# Patient Record
Sex: Female | Born: 1970 | Race: Black or African American | Hispanic: No | Marital: Married | Smoking: Never smoker
Health system: Southern US, Community
[De-identification: ages and names within clinical notes are randomized; demographics above are authoritative.]

## PROBLEM LIST (undated history)

## (undated) DIAGNOSIS — M722 Plantar fascial fibromatosis: Secondary | ICD-10-CM

## (undated) DIAGNOSIS — D573 Sickle-cell trait: Secondary | ICD-10-CM

## (undated) DIAGNOSIS — E559 Vitamin D deficiency, unspecified: Secondary | ICD-10-CM

## (undated) HISTORY — DX: Plantar fascial fibromatosis: M72.2

## (undated) HISTORY — DX: Morbid (severe) obesity due to excess calories: E66.01

## (undated) HISTORY — PX: TUBAL LIGATION: SHX77

## (undated) HISTORY — PX: APPENDECTOMY: SHX54

## (undated) HISTORY — DX: Vitamin D deficiency, unspecified: E55.9

---

## 2008-02-01 ENCOUNTER — Encounter: Payer: Self-pay | Admitting: Family Medicine

## 2008-02-01 ENCOUNTER — Ambulatory Visit: Payer: Self-pay | Admitting: Family Medicine

## 2008-02-06 LAB — CONVERTED CEMR LAB
Basophils Absolute: 0 10*3/uL (ref 0.0–0.1)
Eosinophils Absolute: 0.1 10*3/uL (ref 0.0–0.7)
Eosinophils Relative: 2 % (ref 0–5)
Hemoglobin: 10.7 g/dL — ABNORMAL LOW (ref 12.0–15.0)
Lymphocytes Relative: 23 % (ref 12–46)
Neutro Abs: 4.3 10*3/uL (ref 1.7–7.7)
Platelets: 279 10*3/uL (ref 150–400)
Sickle Cell Screen: POSITIVE — AB
WBC: 6.5 10*3/uL (ref 4.0–10.5)

## 2008-02-08 ENCOUNTER — Encounter (INDEPENDENT_AMBULATORY_CARE_PROVIDER_SITE_OTHER): Payer: Self-pay | Admitting: Family Medicine

## 2008-02-08 ENCOUNTER — Ambulatory Visit: Payer: Self-pay | Admitting: Family Medicine

## 2008-02-08 ENCOUNTER — Encounter: Payer: Self-pay | Admitting: Family Medicine

## 2008-02-08 LAB — CONVERTED CEMR LAB: Protein, U semiquant: NEGATIVE

## 2008-02-09 ENCOUNTER — Encounter: Payer: Self-pay | Admitting: Family Medicine

## 2008-02-09 LAB — CONVERTED CEMR LAB: Pap Smear: NORMAL

## 2008-02-13 ENCOUNTER — Encounter: Payer: Self-pay | Admitting: Family Medicine

## 2008-02-13 ENCOUNTER — Ambulatory Visit (HOSPITAL_COMMUNITY): Admission: RE | Admit: 2008-02-13 | Discharge: 2008-02-13 | Payer: Self-pay | Admitting: Family Medicine

## 2008-02-16 ENCOUNTER — Encounter: Payer: Self-pay | Admitting: Family Medicine

## 2008-03-06 ENCOUNTER — Ambulatory Visit: Payer: Self-pay | Admitting: Family Medicine

## 2008-03-06 LAB — CONVERTED CEMR LAB: Protein, U semiquant: NEGATIVE

## 2008-03-10 ENCOUNTER — Encounter: Payer: Self-pay | Admitting: Family Medicine

## 2008-03-10 LAB — CONVERTED CEMR LAB: GC Culture Only: NEGATIVE

## 2008-04-02 ENCOUNTER — Telehealth: Payer: Self-pay | Admitting: Family Medicine

## 2008-04-08 ENCOUNTER — Ambulatory Visit: Payer: Self-pay | Admitting: Family Medicine

## 2008-04-08 ENCOUNTER — Encounter: Payer: Self-pay | Admitting: Family Medicine

## 2008-04-08 LAB — CONVERTED CEMR LAB
Glucose, Urine, Semiquant: NEGATIVE
Protein, U semiquant: NEGATIVE

## 2008-04-12 ENCOUNTER — Encounter: Payer: Self-pay | Admitting: Family Medicine

## 2008-04-12 ENCOUNTER — Ambulatory Visit (HOSPITAL_COMMUNITY): Admission: RE | Admit: 2008-04-12 | Discharge: 2008-04-12 | Payer: Self-pay | Admitting: Family Medicine

## 2008-04-16 ENCOUNTER — Telehealth: Payer: Self-pay | Admitting: *Deleted

## 2008-05-03 ENCOUNTER — Telehealth: Payer: Self-pay | Admitting: *Deleted

## 2008-05-06 ENCOUNTER — Encounter: Payer: Self-pay | Admitting: Family Medicine

## 2008-05-06 ENCOUNTER — Ambulatory Visit: Payer: Self-pay | Admitting: Sports Medicine

## 2008-05-06 LAB — CONVERTED CEMR LAB: Glucose, Urine, Semiquant: NEGATIVE

## 2008-05-07 ENCOUNTER — Ambulatory Visit: Payer: Self-pay | Admitting: Obstetrics and Gynecology

## 2008-05-07 ENCOUNTER — Inpatient Hospital Stay (HOSPITAL_COMMUNITY): Admission: AD | Admit: 2008-05-07 | Discharge: 2008-05-07 | Payer: Self-pay | Admitting: Obstetrics & Gynecology

## 2008-05-08 ENCOUNTER — Ambulatory Visit: Payer: Self-pay | Admitting: Family Medicine

## 2008-05-08 ENCOUNTER — Encounter: Payer: Self-pay | Admitting: Family Medicine

## 2008-05-08 LAB — CONVERTED CEMR LAB
AST: 9 units/L (ref 0–37)
Albumin: 3.6 g/dL (ref 3.5–5.2)
Alkaline Phosphatase: 48 units/L (ref 39–117)
Basophils Absolute: 0 10*3/uL (ref 0.0–0.1)
CA 125: 5.5 units/mL (ref 0.0–30.2)
Calcium: 9.6 mg/dL (ref 8.4–10.5)
Creatinine, Ser: 0.52 mg/dL (ref 0.40–1.20)
Eosinophils Absolute: 0.1 10*3/uL (ref 0.0–0.7)
Eosinophils Relative: 2 % (ref 0–5)
Glucose, Bld: 87 mg/dL (ref 70–99)
Hemoglobin: 10.7 g/dL — ABNORMAL LOW (ref 12.0–15.0)
Lymphocytes Relative: 21 % (ref 12–46)
Lymphs Abs: 1.6 10*3/uL (ref 0.7–4.0)
MCHC: 33.3 g/dL (ref 30.0–36.0)
MCV: 79.3 fL (ref 78.0–100.0)
Monocytes Absolute: 0.6 10*3/uL (ref 0.1–1.0)
Neutrophils Relative %: 69 % (ref 43–77)
Potassium: 4.3 meq/L (ref 3.5–5.3)
RBC: 4.05 M/uL (ref 3.87–5.11)
Sodium: 140 meq/L (ref 135–145)
Total Bilirubin: 0.3 mg/dL (ref 0.3–1.2)
Total Protein: 6.9 g/dL (ref 6.0–8.3)
WBC: 7.6 10*3/uL (ref 4.0–10.5)

## 2008-05-13 ENCOUNTER — Telehealth: Payer: Self-pay | Admitting: Family Medicine

## 2008-05-14 ENCOUNTER — Telehealth: Payer: Self-pay | Admitting: Family Medicine

## 2008-05-17 ENCOUNTER — Ambulatory Visit: Payer: Self-pay | Admitting: Obstetrics & Gynecology

## 2008-05-24 ENCOUNTER — Encounter: Payer: Self-pay | Admitting: Family Medicine

## 2008-05-24 ENCOUNTER — Ambulatory Visit (HOSPITAL_COMMUNITY): Admission: RE | Admit: 2008-05-24 | Discharge: 2008-05-24 | Payer: Self-pay | Admitting: Obstetrics & Gynecology

## 2008-05-29 ENCOUNTER — Telehealth: Payer: Self-pay | Admitting: *Deleted

## 2008-05-29 ENCOUNTER — Telehealth: Payer: Self-pay | Admitting: Family Medicine

## 2008-05-29 ENCOUNTER — Encounter: Payer: Self-pay | Admitting: Family Medicine

## 2008-06-04 ENCOUNTER — Ambulatory Visit: Payer: Self-pay | Admitting: Family Medicine

## 2008-06-04 ENCOUNTER — Encounter: Payer: Self-pay | Admitting: Family Medicine

## 2008-06-04 LAB — CONVERTED CEMR LAB
Glucose, Urine, Semiquant: NEGATIVE
Protein, U semiquant: NEGATIVE

## 2008-06-13 ENCOUNTER — Telehealth (INDEPENDENT_AMBULATORY_CARE_PROVIDER_SITE_OTHER): Payer: Self-pay

## 2008-06-17 ENCOUNTER — Encounter: Payer: Self-pay | Admitting: Family Medicine

## 2008-06-20 ENCOUNTER — Encounter: Payer: Self-pay | Admitting: Family Medicine

## 2008-06-26 ENCOUNTER — Ambulatory Visit (HOSPITAL_COMMUNITY): Admission: RE | Admit: 2008-06-26 | Discharge: 2008-06-26 | Payer: Self-pay | Admitting: Obstetrics & Gynecology

## 2008-06-26 ENCOUNTER — Encounter: Payer: Self-pay | Admitting: Family Medicine

## 2008-06-26 ENCOUNTER — Telehealth: Payer: Self-pay | Admitting: Family Medicine

## 2008-07-05 ENCOUNTER — Encounter: Payer: Self-pay | Admitting: Family Medicine

## 2008-07-05 ENCOUNTER — Ambulatory Visit: Payer: Self-pay | Admitting: Family Medicine

## 2008-07-05 LAB — CONVERTED CEMR LAB: Glucose, Urine, Semiquant: NEGATIVE

## 2008-07-08 LAB — CONVERTED CEMR LAB
HCT: 30 % — ABNORMAL LOW (ref 36.0–46.0)
MCHC: 33.3 g/dL (ref 30.0–36.0)

## 2008-07-19 ENCOUNTER — Ambulatory Visit (HOSPITAL_COMMUNITY): Admission: RE | Admit: 2008-07-19 | Discharge: 2008-07-19 | Payer: Self-pay | Admitting: Obstetrics & Gynecology

## 2008-07-19 ENCOUNTER — Encounter: Payer: Self-pay | Admitting: Family Medicine

## 2008-07-25 ENCOUNTER — Ambulatory Visit (HOSPITAL_COMMUNITY): Admission: RE | Admit: 2008-07-25 | Discharge: 2008-07-25 | Payer: Self-pay | Admitting: Obstetrics & Gynecology

## 2008-07-26 ENCOUNTER — Ambulatory Visit (HOSPITAL_COMMUNITY): Admission: RE | Admit: 2008-07-26 | Discharge: 2008-07-26 | Payer: Self-pay | Admitting: Obstetrics & Gynecology

## 2008-08-05 ENCOUNTER — Ambulatory Visit: Payer: Self-pay | Admitting: Family Medicine

## 2008-09-09 ENCOUNTER — Encounter: Payer: Self-pay | Admitting: Family Medicine

## 2008-09-09 ENCOUNTER — Ambulatory Visit: Payer: Self-pay | Admitting: Family Medicine

## 2008-09-09 DIAGNOSIS — I10 Essential (primary) hypertension: Secondary | ICD-10-CM | POA: Insufficient documentation

## 2008-11-28 ENCOUNTER — Encounter: Payer: Self-pay | Admitting: *Deleted

## 2009-04-07 ENCOUNTER — Telehealth: Payer: Self-pay | Admitting: Family Medicine

## 2009-04-07 ENCOUNTER — Encounter: Payer: Self-pay | Admitting: Family Medicine

## 2009-05-29 IMAGING — US US OB COMP +14 WK
1 series · 14 of 28 positions shown · non-contrast
Comparison: none

OBSTETRICAL ULTRASOUND:
 This ultrasound exam was performed in the [HOSPITAL] Ultrasound Department.  The OB US report was generated in the AS system, and faxed to the ordering physician.  This report is also available in [REDACTED] PACS.

[Series 1: us ob comp +14 wk · 0.33mm/px · 14 of 32 slices shown]
[im 2/32]
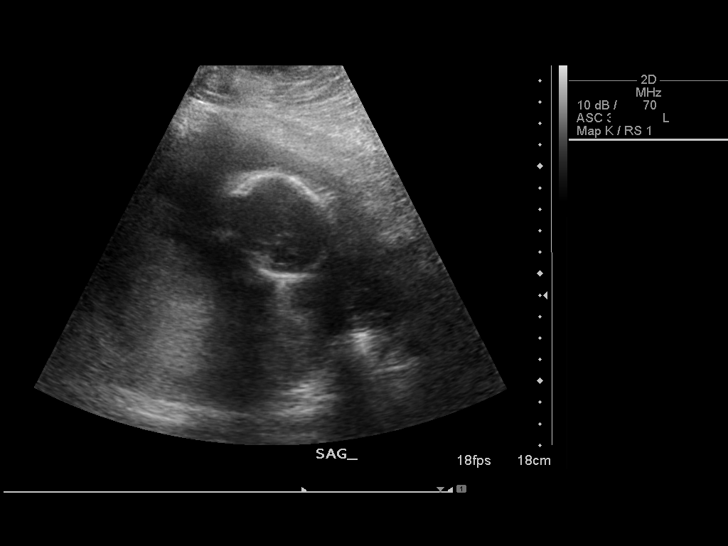
[im 4/32]
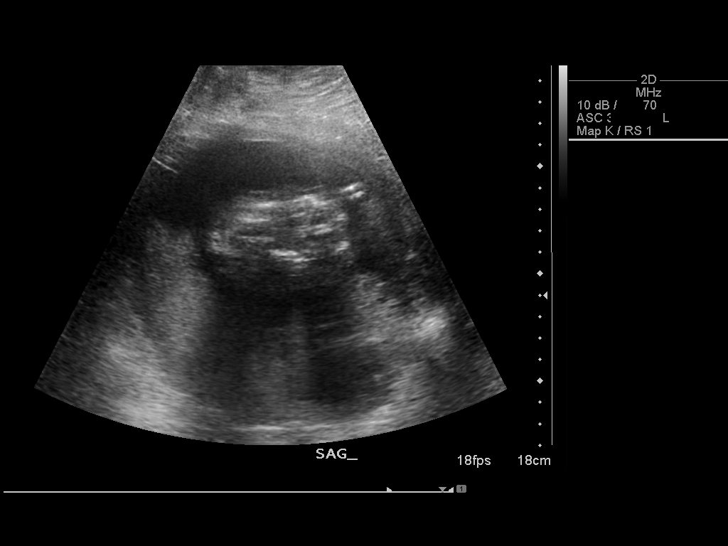
[im 6/32]
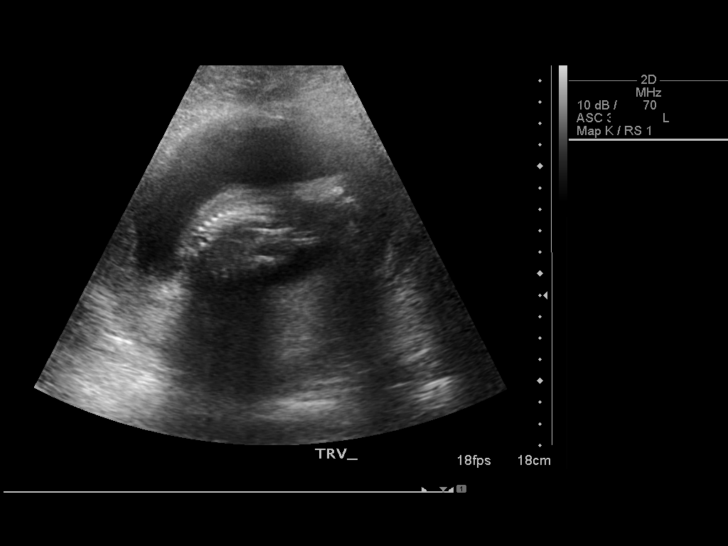
[im 9/32]
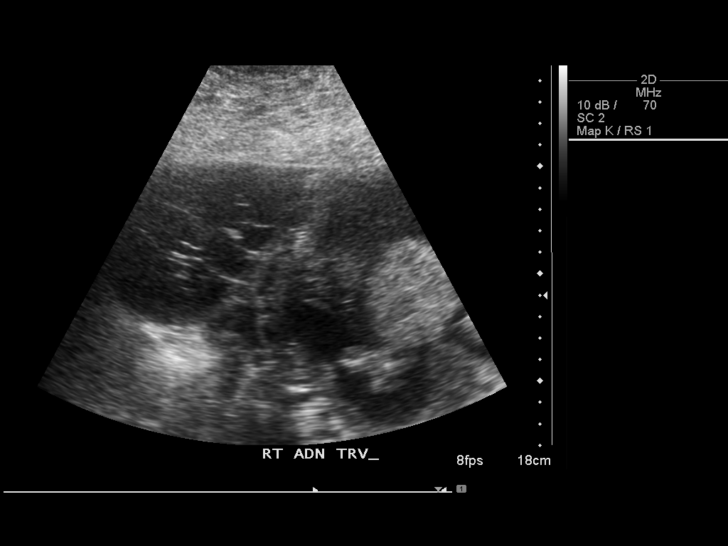
[im 11/32]
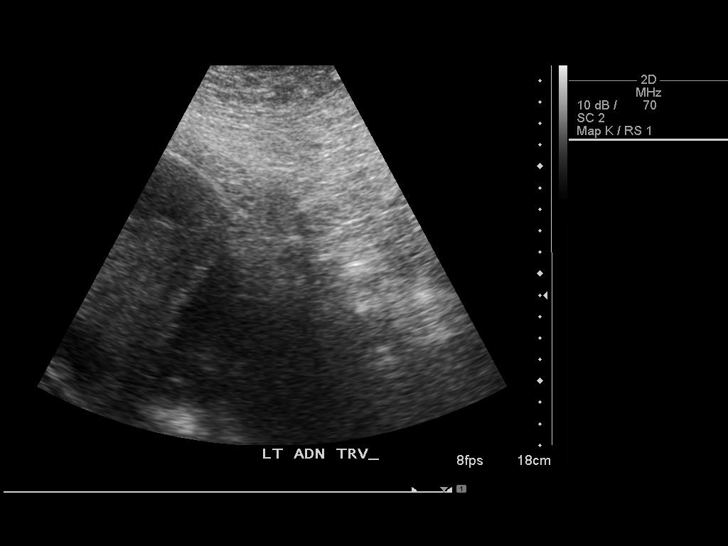
[im 13/32]
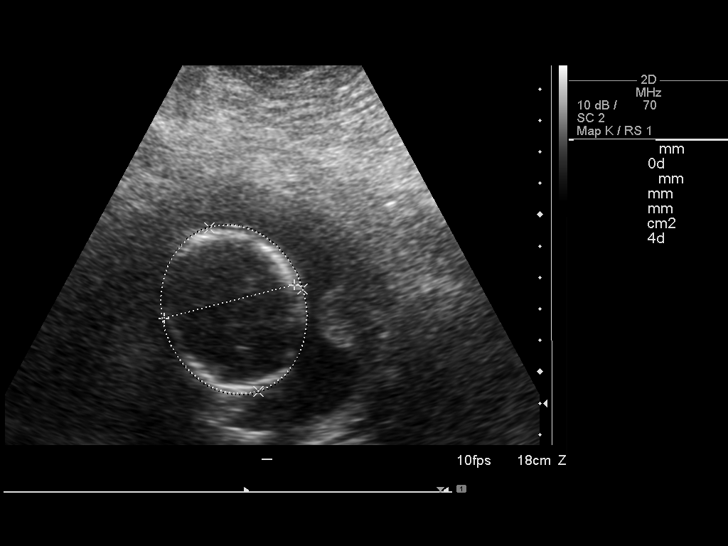
[im 15/32]
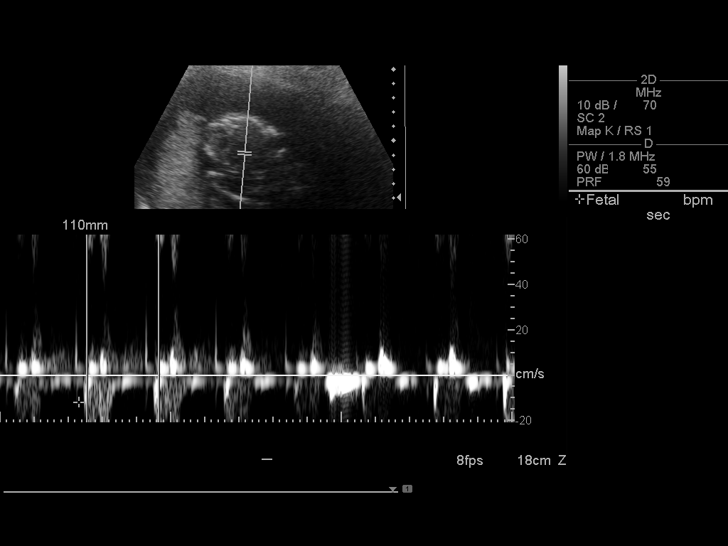
[im 18/32]
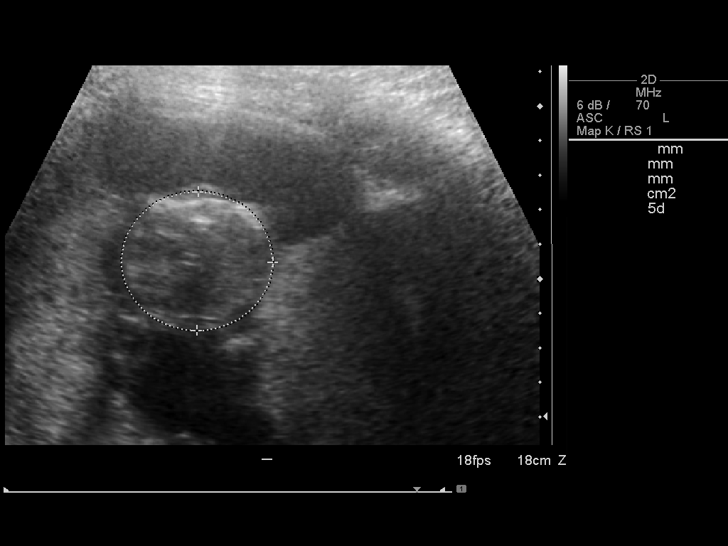
[im 20/32]
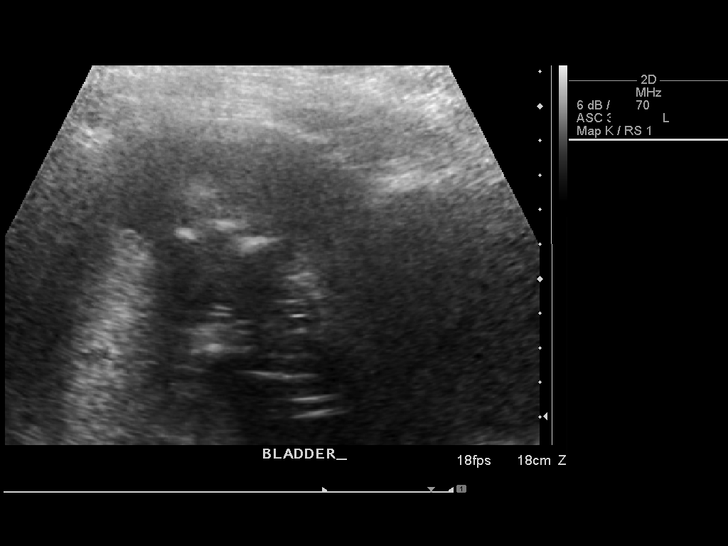
[im 22/32]
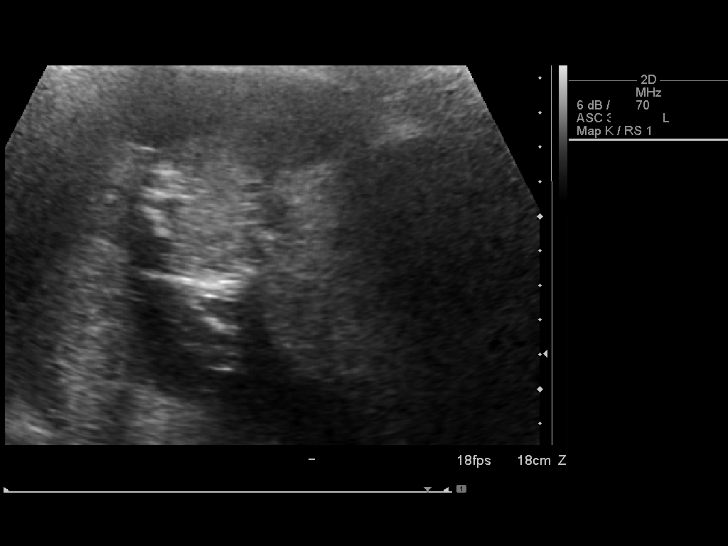
[im 25/32]
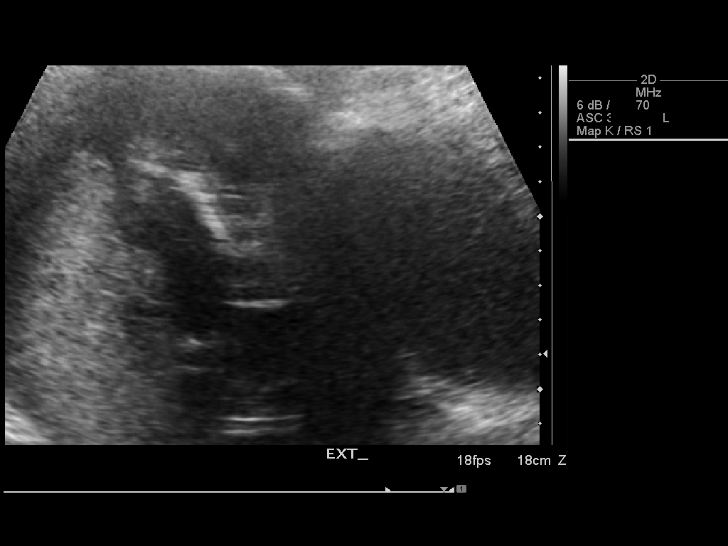
[im 27/32]
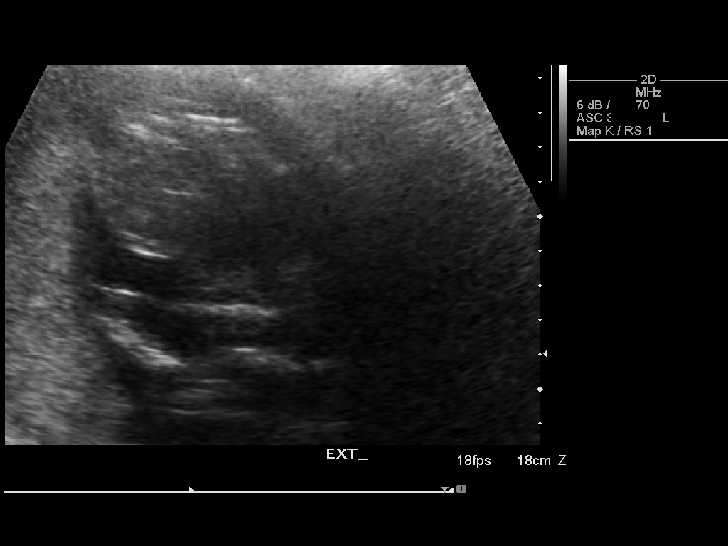
[im 29/32]
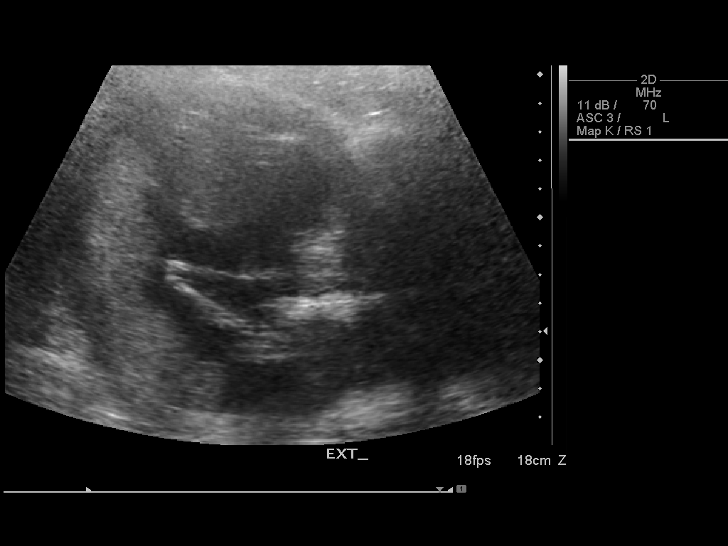
[im 32/32]
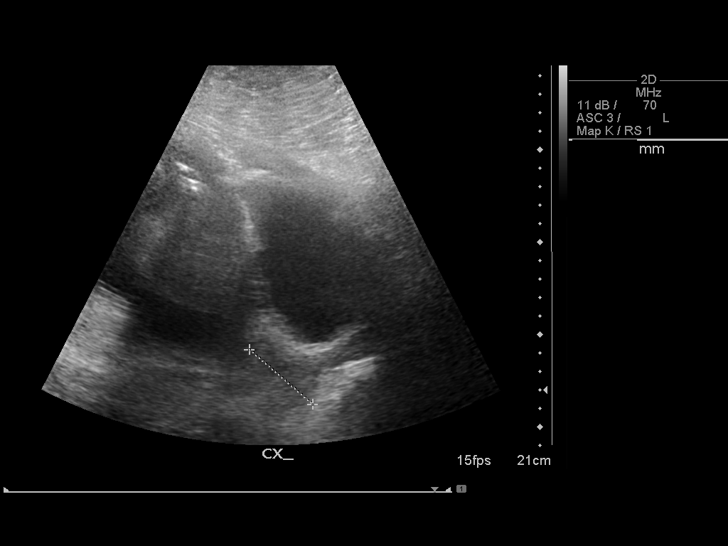

[14 of 28 positions shown; findings below may reference images not displayed]

IMPRESSION: See AS Obstetric US report.

## 2009-07-09 ENCOUNTER — Ambulatory Visit: Payer: Self-pay | Admitting: Family Medicine

## 2009-07-09 ENCOUNTER — Encounter (INDEPENDENT_AMBULATORY_CARE_PROVIDER_SITE_OTHER): Payer: Self-pay | Admitting: Family Medicine

## 2009-07-09 DIAGNOSIS — R011 Cardiac murmur, unspecified: Secondary | ICD-10-CM | POA: Insufficient documentation

## 2009-07-09 DIAGNOSIS — N920 Excessive and frequent menstruation with regular cycle: Secondary | ICD-10-CM | POA: Insufficient documentation

## 2009-07-14 ENCOUNTER — Encounter: Payer: Self-pay | Admitting: Family Medicine

## 2010-10-04 ENCOUNTER — Encounter: Payer: Self-pay | Admitting: Family Medicine

## 2010-10-22 ENCOUNTER — Encounter: Payer: Self-pay | Admitting: *Deleted

## 2011-01-19 ENCOUNTER — Encounter: Payer: Self-pay | Admitting: Family Medicine

## 2011-01-19 ENCOUNTER — Ambulatory Visit (INDEPENDENT_AMBULATORY_CARE_PROVIDER_SITE_OTHER): Payer: BC Managed Care – PPO | Admitting: Family Medicine

## 2011-01-19 DIAGNOSIS — M26629 Arthralgia of temporomandibular joint, unspecified side: Secondary | ICD-10-CM | POA: Insufficient documentation

## 2011-01-19 DIAGNOSIS — R51 Headache: Secondary | ICD-10-CM

## 2011-01-19 DIAGNOSIS — R030 Elevated blood-pressure reading, without diagnosis of hypertension: Secondary | ICD-10-CM

## 2011-01-19 DIAGNOSIS — M2669 Other specified disorders of temporomandibular joint: Secondary | ICD-10-CM

## 2011-01-19 DIAGNOSIS — R519 Headache, unspecified: Secondary | ICD-10-CM | POA: Insufficient documentation

## 2011-01-19 LAB — BASIC METABOLIC PANEL
CO2: 23 mEq/L (ref 19–32)
Calcium: 9.3 mg/dL (ref 8.4–10.5)
Glucose, Bld: 76 mg/dL (ref 70–99)
Sodium: 140 mEq/L (ref 135–145)

## 2011-01-19 MED ORDER — AMLODIPINE BESYLATE 2.5 MG PO TABS: 2.5000 mg | ORAL_TABLET | Freq: Every day | ORAL | Status: DC

## 2011-01-19 NOTE — Assessment & Plan Note (Signed)
This is likely the cause of most of her symptoms including her headache and hearing changes.  She is under a lot of stress recently.  Advised her to work on [employer].  Continue Ibuprofen as needed.  Handout given.

## 2011-01-19 NOTE — Assessment & Plan Note (Signed)
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  It is bitemporal and she is complaining of some blurry vision.  Will check Sed Rate to evaluate for Temporal Arteritis.  She does have some signs of pseudotumor cerebri and is obese.  Neuro exam was normal.  Normal fundoscopic.  If vision doesn't continue to improve would send to ophtalmology for a more comprenesive evaluation.

## 2011-01-19 NOTE — Progress Notes (Signed)
  Subjective:    Patient ID: Sheryl Mcclure, female    DOB: 06-08-1971, 40 y.o.   MRN: [id number]  HPI 1. Headache:  Pt has had a headache for the past 3 days.  Located at both temples.  Overall her headache is getting better.  It has been associated with multiple other symptoms.   - Ear clogged:  She feels that her ears are clogged up.  She is having a difficult time hearing thing as well.  Described as "hearing her heart beat".  Overall this is getting better. - Blurry vision:  She has had some blurry vision where things aren't as clear as they used to be.  Overall this is getting better and is nearly back to normal. - Balance problems:  This started today.  She had trouble maintaining her balance at work.  This is also getting better  2. HTN;  She has had elevated BP readings in the clinic before.  This is her biggest concern.  She doesn't want to have a stroke.  SocHx:  Has started a new job about 2 months ago and has been really stressed out.  Also has lots of other things going on in her life.  Review of Systems  Constitutional: Negative for fever, chills, activity change, appetite change and fatigue.  HENT: Negative for ear pain, congestion, facial swelling, rhinorrhea, neck pain, neck stiffness, postnasal drip, tinnitus and ear discharge.   Eyes: Negative for discharge and itching.  Respiratory: Negative for cough, choking, chest tightness, shortness of breath and wheezing.   Cardiovascular: Negative for chest pain.  Gastrointestinal: Negative.   Musculoskeletal: Negative for arthralgias.  Neurological: Negative for dizziness, syncope, facial asymmetry, speech difficulty, weakness, light-headedness and numbness.  Psychiatric/Behavioral:       + Anxiety       Objective:   Physical Exam  Vitals reviewed. Constitutional: She is oriented to person, place, and time. No distress.       Morbidly obese  HENT:  Head: Normocephalic and atraumatic.  Right Ear: External ear normal.    Left Ear: External ear normal.  Nose: Nose normal.  Mouth/Throat: Oropharynx is clear and moist. No oropharyngeal exudate.       TTP along TMJ and up into the temples.  No swelling, redness, or warmth R ear: normal canal and TM.  Normal landmarks L ear: normal canal and TM.  Normal landmarks  Eyes: Conjunctivae and EOM are normal. Pupils are equal, round, and reactive to light. No scleral icterus.       Normal fundoscopic exam.  No papilledema appreciated.  Neck: Normal range of motion. Neck supple. No thyromegaly present.  Cardiovascular: Normal rate, regular rhythm and normal heart sounds.   Pulmonary/Chest: Effort normal and breath sounds normal. No respiratory distress. She has no wheezes.  Abdominal: Soft.  Musculoskeletal: She exhibits no edema.  Lymphadenopathy:    She has no cervical adenopathy.  Neurological: She is alert and oriented to person, place, and time. She has normal reflexes. She displays normal reflexes. No cranial nerve deficit. She exhibits normal muscle tone. Coordination normal.       Normal gait.  Negative Romberg.  Normal cerebellar tests (RAM, heel-to shin)  Skin: She is not diaphoretic.  Psychiatric:       Anxious appearing          Assessment & Plan:

## 2011-01-19 NOTE — Assessment & Plan Note (Signed)
Not very elevated and doesn't even meet criteria for diagnosis yet.  She is really concerned about her blood pressure so will start her on a low dose Norvasc.

## 2011-01-19 NOTE — Patient Instructions (Signed)
I am not sure what is causing all of your symptoms.  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 We will try and control your blood pressure a little better with Norvasc Please schedule a follow up appointment in 1 weekTMJ Problems  (Temporal Mandibular Joint Dysfunction) TMJ dysfunction means there are problems with the joint between your jaw and your skull. This is a joint lined by cartilage like other joints in your body but also has a small disc in the joint which keeps the bones from rubbing on each other. These joints are like other joints and can get inflamed (sore) from arthritis and other problems. When this joint gets sore, it can cause headaches and pain in the jaw and the face. CAUSES Usually the arthritic types of problems are caused by soreness in the joint. Soreness in the joint can also be caused by overuse. This may come from grinding your teeth. It may also come from mis-alignment in the joint. DIAGNOSIS (LEARNING WHAT IS WRONG) Diagnosis of this condition can often be made by history and exam. Sometimes your caregiver may need X-rays or an MRI scan to determine the exact cause. It may be necessary to see your dentist to determine if your teeth and jaws are lined up correctly. TREATMENT Most of the time this problem is not serious; however, sometimes it can persist (become chronic). When this happens medications that will cut down on inflammation (soreness) help. Sometimes a shot of cortisone into the joint will be helpful. If your teeth are not aligned it may help for your dentist to make a splint for your mouth that can help this problem. If no physical problems can be found, the problem may come from tension. If tension is found to be the cause, biofeedback or relaxation techniques may be helpful. HOME CARE INSTRUCTIONS  Later in the day, applications of ice packs may be helpful. Ice can be used in a plastic bag with a towel around it to prevent frostbite  to skin. This may be used about every 2 hours for 20 to 30 minutes, as needed while awake, or as directed by your caregiver.   Only take over-the-counter or prescription medicines for pain, discomfort, or fever as directed by your caregiver.   If physical therapy was prescribed, follow your caregiver's directions.   Wear mouth appliances as directed if they were given.  Document Released: 05/25/2001 Document Re-Released: 11/26/2008 [facility] Patient Information 2011 [place], [place].

## 2011-01-21 ENCOUNTER — Telehealth: Payer: Self-pay | Admitting: Family Medicine

## 2011-01-21 NOTE — Telephone Encounter (Signed)
Called patient at work.  Her headache has resolved.  Now she is just complaining of feeling weak and tired.  Advised her that the test for inflammation (sed rate) was slightly more elevated than expected.  Advised her to come back next week for reevaluation.

## 2011-01-26 NOTE — Group Therapy Note (Signed)
NAME:  Sheryl Mcclure, Sheryl Mcclure                ACCOUNT NO.:  [id number]   MEDICAL RECORD NO.:  [id number]          PATIENT TYPE:  WOC   LOCATION:  [facility]                   FACILITY:  [facility]   PHYSICIAN:  Elsie Lincoln, MD      DATE OF BIRTH:  1971/01/05   DATE OF SERVICE:                                  CLINIC NOTE   The patient is referred by Dr. Judeth Cornfield in [facility].   REASON FOR CONSULTATION:  Right ovarian cyst noted while the patient is  pregnant.  Cyst is found to be 6.2 x 7.9 x 7.7 cm on an 18-week  ultrasound.  This has shown interval growth compared to her previous  ultrasound done early in June of this year.   The patient is obese but otherwise the pregnancy has been proceeding  normally.  Her EDC is September 09, 2008, currently, today she is a 24  weeks and 1-day estimated gestational age.  She has no complaints.  She  is taking prenatal vitamin and pregnancy monitoring has been otherwise  normal to this point.   PAST MEDICAL HISTORY:  Significant for history of appendectomy status  post rupture and obesity.   ALLERGIES:  No known drug allergies.   MEDICATIONS:  Prenatal vitamins.   PAST OB HISTORY:  The patient is G4, P2-0-1-2 with no history of C-  sections and no abnormal Paps.   REVIEW OF SYSTEMS:  Negative.   OBJECTIVE:  GENERAL:  The patient is afebrile.  Vital signs were stable.  The patient is in no acute distress.  She is obese.  CARDIOVASCULAR:  Regular rate and rhythm.  No murmur, gallops or rubs.  LUNGS:  Clear to auscultation anteriorly, work of breathing is  unlabored.  ABDOMEN:  The patient is obese.  Uterus is gravid.  Fundal height is  estimated to be about 26 cm, however, this exam is very difficult  secondary to the patient's habitus.  EXTREMITIES:  Warm and well perfused.  2+ dorsalis pedis and radial  pulses.   ASSESSMENT AND PLAN:  The patient is a 40 year old female who is G4, P2-  0-1-2 at 52 weeks estimated gestational age  with right ovarian mass  noted on an 18-week ultrasound who is here for consultation of this  right ovarian mass.   PROBLEM:  1. Right ovarian mass.  We discussed the case with Maternal Fetal      Medicine given the patient's late presentation and gestational age      of [redacted] weeks.  Surgical intervention is not recommended at this time      instead we will schedule the patient for followup ultrasound in 1      week with consultation by Maternal Fetal Medicine.  We will likely      not pursue surgical intervention but instead observe the ovary      throughout this pregnancy with stated intervention after completion      of this gestation.  Plan will likely be to monitor with serial      ultrasounds.  This was discussed with the patient and she is in  agreement.  Risks of torsion were discussed with the patient with a      conservative approach.  She is willing to accept these risks.  2. Pregnancy.  These have been routine up to this point, continue      routine OB care, otherwise.     ______________________________  Myrtie Soman, MD    ______________________________  Elsie Lincoln, MD    TE/MEDQ  D:  05/17/2008  T:  05/18/2008  Job:  [id number]   cc:   Maternal-Fetal Medicine  [facility]  [place], [place]

## 2011-05-11 LAB — RPR: RPR: NONREACTIVE

## 2011-05-11 LAB — HIV ANTIBODY (ROUTINE TESTING W REFLEX): HIV: NONREACTIVE

## 2011-05-11 LAB — GC/CHLAMYDIA PROBE AMP, GENITAL: Chlamydia: NEGATIVE

## 2011-06-30 ENCOUNTER — Other Ambulatory Visit (HOSPITAL_COMMUNITY): Payer: Self-pay | Admitting: Obstetrics and Gynecology

## 2011-06-30 DIAGNOSIS — Z3689 Encounter for other specified antenatal screening: Secondary | ICD-10-CM

## 2011-07-06 ENCOUNTER — Ambulatory Visit (HOSPITAL_COMMUNITY)
Admission: RE | Admit: 2011-07-06 | Discharge: 2011-07-06 | Disposition: A | Discharge status: Home / Self Care | Payer: BC Managed Care – PPO | Source: Ambulatory Visit | Attending: Obstetrics and Gynecology | Admitting: Obstetrics and Gynecology

## 2011-07-06 DIAGNOSIS — Z1389 Encounter for screening for other disorder: Secondary | ICD-10-CM | POA: Insufficient documentation

## 2011-07-06 DIAGNOSIS — O09529 Supervision of elderly multigravida, unspecified trimester: Secondary | ICD-10-CM | POA: Insufficient documentation

## 2011-07-06 DIAGNOSIS — O358XX0 Maternal care for other (suspected) fetal abnormality and damage, not applicable or unspecified: Secondary | ICD-10-CM | POA: Insufficient documentation

## 2011-07-06 DIAGNOSIS — Z3689 Encounter for other specified antenatal screening: Secondary | ICD-10-CM

## 2011-07-06 DIAGNOSIS — Z363 Encounter for antenatal screening for malformations: Secondary | ICD-10-CM | POA: Insufficient documentation

## 2011-07-08 ENCOUNTER — Other Ambulatory Visit (HOSPITAL_COMMUNITY): Payer: Self-pay | Admitting: Obstetrics and Gynecology

## 2011-07-08 DIAGNOSIS — Z3689 Encounter for other specified antenatal screening: Secondary | ICD-10-CM

## 2011-07-20 ENCOUNTER — Ambulatory Visit (HOSPITAL_COMMUNITY)
Admission: RE | Admit: 2011-07-20 | Discharge: 2011-07-20 | Disposition: A | Discharge status: Home / Self Care | Payer: BC Managed Care – PPO | Source: Ambulatory Visit | Attending: Obstetrics and Gynecology | Admitting: Obstetrics and Gynecology

## 2011-07-20 DIAGNOSIS — O358XX0 Maternal care for other (suspected) fetal abnormality and damage, not applicable or unspecified: Secondary | ICD-10-CM | POA: Insufficient documentation

## 2011-07-20 DIAGNOSIS — Z3689 Encounter for other specified antenatal screening: Secondary | ICD-10-CM

## 2011-07-20 DIAGNOSIS — O09529 Supervision of elderly multigravida, unspecified trimester: Secondary | ICD-10-CM | POA: Insufficient documentation

## 2011-09-08 ENCOUNTER — Encounter (HOSPITAL_COMMUNITY): Payer: Self-pay

## 2011-09-08 ENCOUNTER — Inpatient Hospital Stay (HOSPITAL_COMMUNITY)
Admission: AD | Admit: 2011-09-08 | Discharge: 2011-09-08 | Disposition: A | Discharge status: Home / Self Care | Payer: BC Managed Care – PPO | Source: Ambulatory Visit | Attending: Obstetrics and Gynecology | Admitting: Obstetrics and Gynecology

## 2011-09-08 DIAGNOSIS — Z3493 Encounter for supervision of normal pregnancy, unspecified, third trimester: Secondary | ICD-10-CM

## 2011-09-08 DIAGNOSIS — Z3480 Encounter for supervision of other normal pregnancy, unspecified trimester: Secondary | ICD-10-CM

## 2011-09-08 DIAGNOSIS — O4690 Antepartum hemorrhage, unspecified, unspecified trimester: Secondary | ICD-10-CM | POA: Insufficient documentation

## 2011-09-08 HISTORY — DX: Sickle-cell trait: D57.3

## 2011-09-08 LAB — URINALYSIS, ROUTINE W REFLEX MICROSCOPIC
Nitrite: NEGATIVE
Protein, ur: NEGATIVE mg/dL
Specific Gravity, Urine: 1.02 (ref 1.005–1.030)
Urobilinogen, UA: 0.2 mg/dL (ref 0.0–1.0)

## 2011-09-08 LAB — URINE MICROSCOPIC-ADD ON

## 2011-09-08 LAB — WET PREP, GENITAL
Trich, Wet Prep: NONE SEEN
Yeast Wet Prep HPF POC: NONE SEEN

## 2011-09-08 NOTE — ED Provider Notes (Signed)
History     Chief Complaint  Patient presents with  . Vaginal Bleeding   HPI Sheryl Mcclure 40 y.o. morbidly obese female at 27w 1d gestation.  Saw blood on the toilet seat at home today after she voided so she came for evaluation.  Had some back pain.  Was very nervous and did not call the office.  Did not have any blood on her underwear.  Did not look at the toilet tissue before flushing.  OB History    Grav Para Term Preterm Abortions TAB SAB Ect Mult Living   5 3 2 1 1  1   3       Past Medical History  Diagnosis Date  . Preterm labor   . Sickle-cell trait     Past Surgical History  Procedure Date  . Appendectomy   . Cesarean section     x1, #3    Family History  Problem Relation Age of Onset  . Hypertension Mother   . Anesthesia problems Neg Hx     History  Substance Use Topics  . Smoking status: Never Smoker   . Smokeless tobacco: Never Used  . Alcohol Use: No    Allergies: No Known Allergies  Prescriptions prior to admission  Medication Sig Dispense Refill  . Prenatal Vit-Fe Fumarate-FA (PRENATAL MULTIVITAMIN) TABS Take 1 tablet by mouth daily.        Marland Kitchen amLODipine (NORVASC) 2.5 MG tablet Take 1 tablet (2.5 mg total) by mouth daily.  30 tablet  11  . Elastic Bandages & Supports (M-4 KNEE HIGH STOCKINGS) MISC please provide patient with knee high TED hose compression stockings         Review of Systems  Genitourinary:       Saw blood on toilet seat    Physical Exam   Blood pressure 123/53, pulse 88, temperature 97.8 F (36.6 C), temperature source Oral, resp. rate 16, height 5\' 6"  (1.676 m), weight 359 lb 6.4 oz (163.023 kg), last menstrual period 12/30/2010.  Physical Exam  Nursing note and vitals reviewed. Constitutional: She is oriented to person, place, and time. She appears well-developed.       Morbidly obese  HENT:  Head: Normocephalic.  Eyes: EOM are normal.  Neck: Neck supple.  GI: Soft. There is no tenderness.       No contractions  on Monitor strip Reassuring FHT for 27 weeks  Genitourinary:       Speculum exam: Vulva - damp with discharge Vagina - Mod amount of yellow discharge, no odor, no blood seen Cervix - No contact bleeding Bimanual exam: Cervix closed Uterus non tender, gravid Adnexa non tender wet prep done Chaperone present for exam.  Musculoskeletal: Normal range of motion.  Neurological: She is alert and oriented to person, place, and time.  Skin: Skin is warm and dry.  Psychiatric: She has a normal mood and affect.    MAU Course  Procedures Results for orders placed during the hospital encounter of 09/08/11 (from the past 24 hour(s))  URINALYSIS, ROUTINE W REFLEX MICROSCOPIC     Status: Abnormal   Collection Time   09/08/11  4:28 PM      Component Value Range   Color, Urine YELLOW  YELLOW    APPearance HAZY (*) CLEAR    Specific Gravity, Urine 1.020  1.005 - 1.030    pH 6.0  5.0 - 8.0    Glucose, UA NEGATIVE  NEGATIVE (mg/dL)   Hgb urine dipstick MODERATE (*) NEGATIVE  Bilirubin Urine NEGATIVE  NEGATIVE    Ketones, ur NEGATIVE  NEGATIVE (mg/dL)   Protein, ur NEGATIVE  NEGATIVE (mg/dL)   Urobilinogen, UA 0.2  0.0 - 1.0 (mg/dL)   Nitrite NEGATIVE  NEGATIVE    Leukocytes, UA LARGE (*) NEGATIVE   URINE MICROSCOPIC-ADD ON     Status: Abnormal   Collection Time   09/08/11  4:28 PM      Component Value Range   Squamous Epithelial / LPF FEW (*) RARE    WBC, UA 7-10  <3 (WBC/hpf)   Bacteria, UA FEW (*) RARE   WET PREP, GENITAL     Status: Abnormal   Collection Time   09/08/11  5:15 PM      Component Value Range   Yeast, Wet Prep NONE SEEN  NONE SEEN    Trich, Wet Prep NONE SEEN  NONE SEEN    Clue Cells, Wet Prep FEW (*) NONE SEEN    WBC, Wet Prep HPF POC TOO NUMEROUS TO COUNT (*) NONE SEEN     MDM Urine culture pending  Assessment and Plan  Pregnancy 27 weeks No vaginal bleeding  Plan Urine culture pending Call the office and be seen if you see any bleeding. Drink at least  8 8-oz glasses of water every day.   BURLESON,TERRI 09/08/2011, 5:40 PM   Nolene Bernheim, NP 09/08/11 1823  Nolene Bernheim, NP 09/11/11 1610

## 2011-09-08 NOTE — Progress Notes (Signed)
One time episode of bleeding today. Hx of spotting earlier in preg, no hx of previa or low lying placenta

## 2011-09-08 NOTE — Progress Notes (Signed)
Pt states started bleeding at 1300 this afternoon while at work, no bleeding at present. Noted lower back pain beginning after 1300, though denies pain at present. Denies uti s/s. Last intercourse 3 days ago.

## 2011-09-08 NOTE — ED Notes (Signed)
Now reports blood was on toilet seat.  ? Had already flushed, no blood in underwear or any since.  No blood noted on exam, yellow d/c noted - wet prep sent

## 2011-09-16 ENCOUNTER — Other Ambulatory Visit (HOSPITAL_COMMUNITY): Payer: Self-pay | Admitting: Obstetrics and Gynecology

## 2011-09-22 ENCOUNTER — Ambulatory Visit (HOSPITAL_COMMUNITY): Payer: BC Managed Care – PPO

## 2011-09-27 ENCOUNTER — Ambulatory Visit (HOSPITAL_COMMUNITY)
Admission: RE | Admit: 2011-09-27 | Discharge: 2011-09-27 | Disposition: A | Discharge status: Home / Self Care | Payer: BC Managed Care – PPO | Source: Ambulatory Visit | Attending: Obstetrics and Gynecology | Admitting: Obstetrics and Gynecology

## 2011-09-27 DIAGNOSIS — Z3689 Encounter for other specified antenatal screening: Secondary | ICD-10-CM | POA: Insufficient documentation

## 2011-09-27 DIAGNOSIS — O09529 Supervision of elderly multigravida, unspecified trimester: Secondary | ICD-10-CM | POA: Insufficient documentation

## 2011-09-27 DIAGNOSIS — O358XX0 Maternal care for other (suspected) fetal abnormality and damage, not applicable or unspecified: Secondary | ICD-10-CM | POA: Insufficient documentation

## 2011-11-12 LAB — STREP B DNA PROBE: GBS: POSITIVE

## 2011-12-10 ENCOUNTER — Observation Stay (HOSPITAL_COMMUNITY)
Admission: RE | Admit: 2011-12-10 | Discharge: 2011-12-11 | Disposition: A | Discharge status: Home / Self Care | Payer: BC Managed Care – PPO | Source: Ambulatory Visit | Attending: Obstetrics & Gynecology | Admitting: Obstetrics & Gynecology

## 2011-12-10 ENCOUNTER — Encounter (HOSPITAL_COMMUNITY): Payer: Self-pay

## 2011-12-10 ENCOUNTER — Inpatient Hospital Stay (HOSPITAL_COMMUNITY): Payer: BC Managed Care – PPO

## 2011-12-10 VITALS — BP 120/58 | HR 105 | Temp 98.1°F | Resp 18 | Ht 67.0 in | Wt 355.0 lb

## 2011-12-10 DIAGNOSIS — Z3689 Encounter for other specified antenatal screening: Secondary | ICD-10-CM

## 2011-12-10 DIAGNOSIS — O99891 Other specified diseases and conditions complicating pregnancy: Secondary | ICD-10-CM | POA: Insufficient documentation

## 2011-12-10 DIAGNOSIS — Z22330 Carrier of Group B streptococcus: Secondary | ICD-10-CM | POA: Insufficient documentation

## 2011-12-10 DIAGNOSIS — O34219 Maternal care for unspecified type scar from previous cesarean delivery: Secondary | ICD-10-CM | POA: Insufficient documentation

## 2011-12-10 DIAGNOSIS — O321XX0 Maternal care for breech presentation, not applicable or unspecified: Principal | ICD-10-CM | POA: Insufficient documentation

## 2011-12-10 LAB — CBC
Hemoglobin: 10.5 g/dL — ABNORMAL LOW (ref 12.0–15.0)
RBC: 3.97 MIL/uL (ref 3.87–5.11)

## 2011-12-10 MED ORDER — ACETAMINOPHEN 325 MG PO TABS: 650.0000 mg | ORAL_TABLET | ORAL | Status: DC | PRN

## 2011-12-10 MED ORDER — BUTORPHANOL TARTRATE 2 MG/ML IJ SOLN: 1.0000 mg | INTRAMUSCULAR | Status: DC | PRN

## 2011-12-10 MED ORDER — OXYCODONE-ACETAMINOPHEN 5-325 MG PO TABS: 1.0000 | ORAL_TABLET | ORAL | Status: DC | PRN

## 2011-12-10 MED ORDER — LACTATED RINGERS IV SOLN: INTRAVENOUS | Status: DC

## 2011-12-10 MED ORDER — FLEET ENEMA 7-19 GM/118ML RE ENEM: 1.0000 | ENEMA | RECTAL | Status: DC | PRN

## 2011-12-10 MED ORDER — LACTATED RINGERS IV SOLN: 500.0000 mL | INTRAVENOUS | Status: DC | PRN

## 2011-12-10 MED ORDER — OXYTOCIN BOLUS FROM INFUSION
500.0000 mL | Freq: Once | INTRAVENOUS | Status: DC
  Filled 2011-12-10: qty 500

## 2011-12-10 MED ORDER — DEXTROSE 5 % IV SOLN
2.5000 10*6.[IU] | INTRAVENOUS | Status: DC
  Filled 2011-12-10 (×4): qty 2.5

## 2011-12-10 MED ORDER — LIDOCAINE HCL (PF) 1 % IJ SOLN: 30.0000 mL | INTRAMUSCULAR | Status: DC | PRN

## 2011-12-10 MED ORDER — ONDANSETRON HCL 4 MG/2ML IJ SOLN: 4.0000 mg | Freq: Four times a day (QID) | INTRAMUSCULAR | Status: DC | PRN

## 2011-12-10 MED ORDER — CITRIC ACID-SODIUM CITRATE 334-500 MG/5ML PO SOLN: 30.0000 mL | ORAL | Status: DC | PRN

## 2011-12-10 MED ORDER — OXYTOCIN 20 UNITS IN LACTATED RINGERS INFUSION - SIMPLE: 125.0000 mL/h | Freq: Once | INTRAVENOUS | Status: DC

## 2011-12-10 MED ORDER — PENICILLIN G POTASSIUM 5000000 UNITS IJ SOLR
5.0000 10*6.[IU] | Freq: Once | INTRAVENOUS | Status: AC
  Administered 2011-12-10: 5 10*6.[IU] via INTRAVENOUS
  Filled 2011-12-10: qty 5

## 2011-12-10 MED ORDER — IBUPROFEN 600 MG PO TABS: 600.0000 mg | ORAL_TABLET | Freq: Four times a day (QID) | ORAL | Status: DC | PRN

## 2011-12-10 NOTE — Progress Notes (Signed)
Kaplan performed SVE. MD requested U/S to verify presenting part. MD performed Bedside U/S and confirmed Breech presentation.  Orders for BPP and plan to D/C home if WNL and schedule c/s for Monday.

## 2011-12-10 NOTE — Progress Notes (Signed)
On initial exam vertex had been confirmed by bedside ultrasound and was palpated on cervical exam.  I planned to place the Foley after GBS prophylaxis was begun.  When this had been accomplished I attempted to place the foley and I could no longer palpate the internal os or the presenting part.  Repeat ultrasound showed the baby had spontaneously verted to Breech.  I will do a formal ultrasound BPP, EFW.  Will discharge home and plan to proceed with repeat C/S and BTL on April 1st unless she spontaneously reverts to vertex.

## 2011-12-10 NOTE — Progress Notes (Signed)
Dr. Arlyce Dice called. Coming to place foley bulb.  Md notified of SVE with RN unable to verify presenting part.

## 2011-12-10 NOTE — H&P (Signed)
40 y.o. [id number]  Estimated Date of Delivery: 12/07/11 admitted at 40/[redacted] weeks gestation induction. Prenatal course was complicated by previous Cesarean.  She had her first 2 children vaginally.  Her BMI is 55. Prenatal labs: Blood Type:O+.  Screening tests for HIV, Syphilis, Hepatitis B, Rubella sensitivity, and gestational diabetes were negative.  Perineal group B strep colonization was positive.  Afebrile, VSS Heart and Lungs: No active disease Abdomen: soft, gravid, EFW AGA. Cervical exam:  1/40 vtx -2.  Impression: Term pregnancy, previous C/S.  Previous vaginal deliveries x2.  Plan:  Foley bulb for cervical ripening.

## 2011-12-11 ENCOUNTER — Encounter (HOSPITAL_COMMUNITY): Payer: Self-pay | Admitting: Pharmacist

## 2011-12-11 ENCOUNTER — Inpatient Hospital Stay (HOSPITAL_COMMUNITY): Payer: BC Managed Care – PPO

## 2011-12-11 LAB — RPR: RPR Ser Ql: NONREACTIVE

## 2011-12-11 NOTE — Progress Notes (Signed)
Pt back from US

## 2011-12-11 NOTE — Progress Notes (Signed)
Called Dr. Arlyce Dice with U/S results. BPP 8/8. Fluid level normal. MD aware. Diacharge orders given. Have pt return Monday 0900 for scheduled C/S at 12:00.

## 2011-12-11 NOTE — Discharge Summary (Signed)
Discharge Diagnosis: Breech Procedure: Ultrasound Hospital Course:  Presented for cervical ripening.  Vertex by bedside ultrasound and digital exam by me.  Given GBS prophyaxis.  Reexam 2 hours later by me revealed Breech on digital exam and ultrasound.  Discharge home to return April 1 for C/S if still breech.

## 2011-12-11 NOTE — Progress Notes (Signed)
Discharge instructions given to pt, along with instructions to return Monday morning at 0900 for C/S at noon. Pt instructed not to eat or drink after midnight. Pt verbalized understanding.

## 2011-12-13 ENCOUNTER — Encounter (HOSPITAL_COMMUNITY): Payer: Self-pay | Admitting: Anesthesiology

## 2011-12-13 ENCOUNTER — Inpatient Hospital Stay (HOSPITAL_COMMUNITY)
Admission: RE | Admit: 2011-12-13 | Discharge: 2011-12-17 | DRG: 371 | Disposition: A | Discharge status: Home / Self Care | Payer: BC Managed Care – PPO | Source: Ambulatory Visit | Attending: Obstetrics and Gynecology | Admitting: Obstetrics and Gynecology

## 2011-12-13 ENCOUNTER — Encounter (HOSPITAL_COMMUNITY): Payer: Self-pay | Admitting: *Deleted

## 2011-12-13 ENCOUNTER — Encounter (HOSPITAL_COMMUNITY)
Admission: RE | Disposition: A | Discharge status: Home / Self Care | Payer: Self-pay | Source: Ambulatory Visit | Attending: Obstetrics and Gynecology

## 2011-12-13 ENCOUNTER — Inpatient Hospital Stay (HOSPITAL_COMMUNITY): Payer: BC Managed Care – PPO | Admitting: Anesthesiology

## 2011-12-13 DIAGNOSIS — I10 Essential (primary) hypertension: Secondary | ICD-10-CM

## 2011-12-13 DIAGNOSIS — O99892 Other specified diseases and conditions complicating childbirth: Secondary | ICD-10-CM | POA: Diagnosis present

## 2011-12-13 DIAGNOSIS — O322XX0 Maternal care for transverse and oblique lie, not applicable or unspecified: Secondary | ICD-10-CM | POA: Diagnosis present

## 2011-12-13 DIAGNOSIS — Z302 Encounter for sterilization: Secondary | ICD-10-CM

## 2011-12-13 DIAGNOSIS — M26629 Arthralgia of temporomandibular joint, unspecified side: Secondary | ICD-10-CM

## 2011-12-13 DIAGNOSIS — N736 Female pelvic peritoneal adhesions (postinfective): Secondary | ICD-10-CM | POA: Diagnosis present

## 2011-12-13 DIAGNOSIS — R011 Cardiac murmur, unspecified: Secondary | ICD-10-CM

## 2011-12-13 DIAGNOSIS — R51 Headache: Secondary | ICD-10-CM

## 2011-12-13 DIAGNOSIS — Z3480 Encounter for supervision of other normal pregnancy, unspecified trimester: Secondary | ICD-10-CM

## 2011-12-13 DIAGNOSIS — O09529 Supervision of elderly multigravida, unspecified trimester: Secondary | ICD-10-CM | POA: Diagnosis present

## 2011-12-13 DIAGNOSIS — N920 Excessive and frequent menstruation with regular cycle: Secondary | ICD-10-CM

## 2011-12-13 DIAGNOSIS — O34219 Maternal care for unspecified type scar from previous cesarean delivery: Principal | ICD-10-CM | POA: Diagnosis present

## 2011-12-13 DIAGNOSIS — E669 Obesity, unspecified: Secondary | ICD-10-CM | POA: Diagnosis present

## 2011-12-13 SURGERY — Surgical Case
Anesthesia: Spinal | Site: Abdomen | Wound class: Clean Contaminated

## 2011-12-13 MED ORDER — PHENYLEPHRINE 40 MCG/ML (10ML) SYRINGE FOR IV PUSH (FOR BLOOD PRESSURE SUPPORT)
PREFILLED_SYRINGE | INTRAVENOUS | Status: AC
  Filled 2011-12-13: qty 5

## 2011-12-13 MED ORDER — FENTANYL CITRATE 0.05 MG/ML IJ SOLN
INTRAMUSCULAR | Status: AC
  Filled 2011-12-13: qty 2

## 2011-12-13 MED ORDER — ONDANSETRON HCL 4 MG/2ML IJ SOLN
INTRAMUSCULAR | Status: DC | PRN
  Administered 2011-12-13: 4 mg via INTRAVENOUS

## 2011-12-13 MED ORDER — EPHEDRINE SULFATE 50 MG/ML IJ SOLN
INTRAMUSCULAR | Status: DC | PRN
  Administered 2011-12-13: 15 mg via INTRAVENOUS
  Administered 2011-12-13: 10 mg via INTRAVENOUS

## 2011-12-13 MED ORDER — PHENYLEPHRINE HCL 10 MG/ML IJ SOLN
INTRAMUSCULAR | Status: DC | PRN
  Administered 2011-12-13: 80 ug via INTRAVENOUS
  Administered 2011-12-13: 120 ug via INTRAVENOUS
  Administered 2011-12-13: 80 ug via INTRAVENOUS
  Administered 2011-12-13: 40 ug via INTRAVENOUS
  Administered 2011-12-13 (×2): 80 ug via INTRAVENOUS

## 2011-12-13 MED ORDER — KETOROLAC TROMETHAMINE 60 MG/2ML IM SOLN
60.0000 mg | Freq: Once | INTRAMUSCULAR | Status: AC | PRN
  Administered 2011-12-13: 60 mg via INTRAMUSCULAR

## 2011-12-13 MED ORDER — ONDANSETRON HCL 4 MG PO TABS: 4.0000 mg | ORAL_TABLET | ORAL | Status: DC | PRN

## 2011-12-13 MED ORDER — OXYCODONE-ACETAMINOPHEN 5-325 MG PO TABS
1.0000 | ORAL_TABLET | ORAL | Status: DC | PRN
  Administered 2011-12-15 (×2): 2 via ORAL
  Administered 2011-12-15: 1 via ORAL
  Administered 2011-12-15: 2 via ORAL
  Administered 2011-12-16 (×2): 1 via ORAL
  Administered 2011-12-16 (×2): 2 via ORAL
  Administered 2011-12-16: 1 via ORAL
  Administered 2011-12-17 (×3): 2 via ORAL
  Filled 2011-12-13: qty 2
  Filled 2011-12-13 (×3): qty 1
  Filled 2011-12-13 (×5): qty 2
  Filled 2011-12-13: qty 1
  Filled 2011-12-13 (×2): qty 2
  Filled 2011-12-13: qty 1

## 2011-12-13 MED ORDER — ONDANSETRON HCL 4 MG/2ML IJ SOLN: 4.0000 mg | Freq: Three times a day (TID) | INTRAMUSCULAR | Status: DC | PRN

## 2011-12-13 MED ORDER — SODIUM CHLORIDE 0.9 % IV SOLN
1.0000 ug/kg/h | INTRAVENOUS | Status: DC | PRN
  Filled 2011-12-13: qty 2.5

## 2011-12-13 MED ORDER — MEASLES, MUMPS & RUBELLA VAC ~~LOC~~ INJ
0.5000 mL | INJECTION | Freq: Once | SUBCUTANEOUS | Status: DC
  Filled 2011-12-13: qty 0.5

## 2011-12-13 MED ORDER — IBUPROFEN 600 MG PO TABS
600.0000 mg | ORAL_TABLET | Freq: Four times a day (QID) | ORAL | Status: DC | PRN
  Filled 2011-12-13 (×5): qty 1

## 2011-12-13 MED ORDER — KETOROLAC TROMETHAMINE 30 MG/ML IJ SOLN
INTRAMUSCULAR | Status: AC
  Filled 2011-12-13: qty 1

## 2011-12-13 MED ORDER — BUPIVACAINE IN DEXTROSE 0.75-8.25 % IT SOLN
INTRATHECAL | Status: DC | PRN
  Administered 2011-12-13: 1.7 mL via INTRATHECAL

## 2011-12-13 MED ORDER — MEPERIDINE HCL 25 MG/ML IJ SOLN
INTRAMUSCULAR | Status: AC
  Filled 2011-12-13: qty 1

## 2011-12-13 MED ORDER — KETOROLAC TROMETHAMINE 30 MG/ML IJ SOLN: 30.0000 mg | Freq: Four times a day (QID) | INTRAMUSCULAR | Status: AC | PRN

## 2011-12-13 MED ORDER — WITCH HAZEL-GLYCERIN EX PADS: 1.0000 "application " | MEDICATED_PAD | CUTANEOUS | Status: DC | PRN

## 2011-12-13 MED ORDER — ONDANSETRON HCL 4 MG/2ML IJ SOLN: 4.0000 mg | INTRAMUSCULAR | Status: DC | PRN

## 2011-12-13 MED ORDER — EPHEDRINE 5 MG/ML INJ
INTRAVENOUS | Status: AC
  Filled 2011-12-13: qty 10

## 2011-12-13 MED ORDER — MEPERIDINE HCL 25 MG/ML IJ SOLN: 6.2500 mg | INTRAMUSCULAR | Status: DC | PRN

## 2011-12-13 MED ORDER — ZOLPIDEM TARTRATE 5 MG PO TABS: 5.0000 mg | ORAL_TABLET | Freq: Every evening | ORAL | Status: DC | PRN

## 2011-12-13 MED ORDER — MIDAZOLAM HCL 2 MG/2ML IJ SOLN
INTRAMUSCULAR | Status: AC
  Filled 2011-12-13: qty 2

## 2011-12-13 MED ORDER — DIPHENHYDRAMINE HCL 50 MG/ML IJ SOLN: 12.5000 mg | INTRAMUSCULAR | Status: DC | PRN

## 2011-12-13 MED ORDER — MORPHINE SULFATE (PF) 0.5 MG/ML IJ SOLN
INTRAMUSCULAR | Status: DC | PRN
  Administered 2011-12-13: .15 mg via EPIDURAL

## 2011-12-13 MED ORDER — SCOPOLAMINE 1 MG/3DAYS TD PT72
1.0000 | MEDICATED_PATCH | Freq: Once | TRANSDERMAL | Status: DC
  Filled 2011-12-13: qty 1

## 2011-12-13 MED ORDER — IBUPROFEN 600 MG PO TABS
600.0000 mg | ORAL_TABLET | Freq: Four times a day (QID) | ORAL | Status: DC
  Administered 2011-12-14 – 2011-12-17 (×11): 600 mg via ORAL
  Filled 2011-12-13 (×7): qty 1

## 2011-12-13 MED ORDER — LACTATED RINGERS IV SOLN
INTRAVENOUS | Status: DC
  Administered 2011-12-13 (×2): via INTRAVENOUS

## 2011-12-13 MED ORDER — MUPIROCIN 2 % EX OINT
TOPICAL_OINTMENT | Freq: Two times a day (BID) | CUTANEOUS | Status: DC
  Administered 2011-12-13: 1 via NASAL

## 2011-12-13 MED ORDER — MORPHINE SULFATE 0.5 MG/ML IJ SOLN
INTRAMUSCULAR | Status: AC
  Filled 2011-12-13: qty 10

## 2011-12-13 MED ORDER — OXYTOCIN 10 UNIT/ML IJ SOLN
INTRAMUSCULAR | Status: AC
  Filled 2011-12-13: qty 2

## 2011-12-13 MED ORDER — SODIUM CHLORIDE 0.9 % IJ SOLN
3.0000 mL | INTRAMUSCULAR | Status: DC | PRN
  Administered 2011-12-15: 3 mL via INTRAVENOUS

## 2011-12-13 MED ORDER — DEXAMETHASONE SODIUM PHOSPHATE 10 MG/ML IJ SOLN
INTRAMUSCULAR | Status: DC | PRN
  Administered 2011-12-13: 10 mg via INTRAVENOUS

## 2011-12-13 MED ORDER — PRENATAL MULTIVITAMIN CH
1.0000 | ORAL_TABLET | Freq: Every day | ORAL | Status: DC
  Administered 2011-12-16 – 2011-12-17 (×2): 1 via ORAL
  Filled 2011-12-13 (×3): qty 1

## 2011-12-13 MED ORDER — MIDAZOLAM HCL 5 MG/5ML IJ SOLN
INTRAMUSCULAR | Status: DC | PRN
  Administered 2011-12-13: 2 mg via INTRAVENOUS

## 2011-12-13 MED ORDER — OXYTOCIN 20 UNITS IN LACTATED RINGERS INFUSION - SIMPLE: 125.0000 mL/h | INTRAVENOUS | Status: AC

## 2011-12-13 MED ORDER — KETOROLAC TROMETHAMINE 60 MG/2ML IM SOLN
INTRAMUSCULAR | Status: AC
  Administered 2011-12-13: 60 mg via INTRAMUSCULAR
  Filled 2011-12-13: qty 2

## 2011-12-13 MED ORDER — DEXAMETHASONE SODIUM PHOSPHATE 10 MG/ML IJ SOLN
INTRAMUSCULAR | Status: AC
  Filled 2011-12-13: qty 1

## 2011-12-13 MED ORDER — DIPHENHYDRAMINE HCL 50 MG/ML IJ SOLN: 25.0000 mg | INTRAMUSCULAR | Status: DC | PRN

## 2011-12-13 MED ORDER — HYDROMORPHONE HCL PF 1 MG/ML IJ SOLN
0.2500 mg | INTRAMUSCULAR | Status: DC | PRN
  Administered 2011-12-13 (×2): 0.5 mg via INTRAVENOUS

## 2011-12-13 MED ORDER — OXYTOCIN 10 UNIT/ML IJ SOLN
INTRAMUSCULAR | Status: DC | PRN
  Administered 2011-12-13 (×2): 20 [IU] via INTRAMUSCULAR

## 2011-12-13 MED ORDER — SCOPOLAMINE 1 MG/3DAYS TD PT72
MEDICATED_PATCH | TRANSDERMAL | Status: AC
  Administered 2011-12-13: 1.5 mg via TRANSDERMAL
  Filled 2011-12-13: qty 1

## 2011-12-13 MED ORDER — SCOPOLAMINE 1 MG/3DAYS TD PT72
1.0000 | MEDICATED_PATCH | Freq: Once | TRANSDERMAL | Status: AC
  Administered 2011-12-13: 1 via TRANSDERMAL
  Administered 2011-12-13: 1.5 mg via TRANSDERMAL

## 2011-12-13 MED ORDER — METOCLOPRAMIDE HCL 5 MG/ML IJ SOLN: 10.0000 mg | Freq: Three times a day (TID) | INTRAMUSCULAR | Status: DC | PRN

## 2011-12-13 MED ORDER — NALOXONE HCL 0.4 MG/ML IJ SOLN: 0.4000 mg | INTRAMUSCULAR | Status: DC | PRN

## 2011-12-13 MED ORDER — TETANUS-DIPHTH-ACELL PERTUSSIS 5-2.5-18.5 LF-MCG/0.5 IM SUSP
0.5000 mL | Freq: Once | INTRAMUSCULAR | Status: AC
  Administered 2011-12-14: 0.5 mL via INTRAMUSCULAR
  Filled 2011-12-13: qty 0.5

## 2011-12-13 MED ORDER — DIBUCAINE 1 % RE OINT: 1.0000 "application " | TOPICAL_OINTMENT | RECTAL | Status: DC | PRN

## 2011-12-13 MED ORDER — CEFAZOLIN SODIUM 1-5 GM-% IV SOLN
INTRAVENOUS | Status: AC
  Filled 2011-12-13: qty 50

## 2011-12-13 MED ORDER — PHENYLEPHRINE 40 MCG/ML (10ML) SYRINGE FOR IV PUSH (FOR BLOOD PRESSURE SUPPORT)
PREFILLED_SYRINGE | INTRAVENOUS | Status: AC
  Filled 2011-12-13: qty 10

## 2011-12-13 MED ORDER — NALBUPHINE HCL 10 MG/ML IJ SOLN
5.0000 mg | INTRAMUSCULAR | Status: DC | PRN
  Filled 2011-12-13: qty 1

## 2011-12-13 MED ORDER — FENTANYL CITRATE 0.05 MG/ML IJ SOLN
INTRAMUSCULAR | Status: DC | PRN
  Administered 2011-12-13: 25 ug via INTRATHECAL

## 2011-12-13 MED ORDER — LACTATED RINGERS IV SOLN
INTRAVENOUS | Status: DC
  Administered 2011-12-13 – 2011-12-15 (×6): via INTRAVENOUS

## 2011-12-13 MED ORDER — KETOROLAC TROMETHAMINE 30 MG/ML IJ SOLN
30.0000 mg | Freq: Four times a day (QID) | INTRAMUSCULAR | Status: AC | PRN
  Administered 2011-12-13 – 2011-12-14 (×3): 30 mg via INTRAVENOUS
  Filled 2011-12-13 (×3): qty 1

## 2011-12-13 MED ORDER — DEXTROSE 5 % IV SOLN
1.0000 g | Freq: Two times a day (BID) | INTRAVENOUS | Status: AC
  Administered 2011-12-13 – 2011-12-15 (×4): 1 g via INTRAVENOUS
  Filled 2011-12-13 (×4): qty 1

## 2011-12-13 MED ORDER — 0.9 % SODIUM CHLORIDE (POUR BTL) OPTIME
TOPICAL | Status: DC | PRN
  Administered 2011-12-13: 1000 mL

## 2011-12-13 MED ORDER — ONDANSETRON HCL 4 MG/2ML IJ SOLN
INTRAMUSCULAR | Status: AC
  Filled 2011-12-13: qty 2

## 2011-12-13 MED ORDER — HYDROMORPHONE HCL PF 1 MG/ML IJ SOLN
INTRAMUSCULAR | Status: AC
  Administered 2011-12-13: 0.5 mg via INTRAVENOUS
  Filled 2011-12-13: qty 1

## 2011-12-13 MED ORDER — FENTANYL CITRATE 0.05 MG/ML IJ SOLN
INTRAMUSCULAR | Status: DC | PRN
Start: 2011-12-13 — End: 2011-12-13
  Administered 2011-12-13 (×2): 50 ug via INTRAVENOUS

## 2011-12-13 MED ORDER — MORPHINE SULFATE (PF) 0.5 MG/ML IJ SOLN
INTRAMUSCULAR | Status: DC | PRN
  Administered 2011-12-13: 4 mg via EPIDURAL
  Administered 2011-12-13: .85 mg via INTRAVENOUS

## 2011-12-13 MED ORDER — MUPIROCIN 2 % EX OINT
TOPICAL_OINTMENT | CUTANEOUS | Status: AC
  Administered 2011-12-13: 1 via NASAL
  Filled 2011-12-13: qty 22

## 2011-12-13 MED ORDER — CEFAZOLIN SODIUM-DEXTROSE 2-3 GM-% IV SOLR
2.0000 g | INTRAVENOUS | Status: AC
  Administered 2011-12-13: 2 g via INTRAVENOUS
  Filled 2011-12-13: qty 50

## 2011-12-13 MED ORDER — DIPHENHYDRAMINE HCL 25 MG PO CAPS: 25.0000 mg | ORAL_CAPSULE | ORAL | Status: DC | PRN

## 2011-12-13 SURGICAL SUPPLY — 35 items
BARRIER ADHS 3X4 INTERCEED (GAUZE/BANDAGES/DRESSINGS) ×6 IMPLANT
CLIP FILSHIE TUBAL LIGA STRL (Clip) ×3 IMPLANT
CLOTH BEACON ORANGE TIMEOUT ST (SAFETY) ×3 IMPLANT
CONTAINER PREFILL 10% NBF 15ML (MISCELLANEOUS) IMPLANT
DRAPE CESAREAN BIRTH W POUCH (DRAPES) ×3 IMPLANT
DRESSING TELFA 8X3 (GAUZE/BANDAGES/DRESSINGS) ×3 IMPLANT
ELECT REM PT RETURN 9FT ADLT (ELECTROSURGICAL) ×3
ELECTRODE REM PT RTRN 9FT ADLT (ELECTROSURGICAL) ×2 IMPLANT
EXTRACTOR VACUUM M CUP 4 TUBE (SUCTIONS) ×3 IMPLANT
GLOVE ECLIPSE 7.0 STRL STRAW (GLOVE) ×15 IMPLANT
GOWN PREVENTION PLUS LG XLONG (DISPOSABLE) ×3 IMPLANT
GOWN PREVENTION PLUS XLARGE (GOWN DISPOSABLE) ×3 IMPLANT
KIT ABG SYR 3ML LUER SLIP (SYRINGE) IMPLANT
NEEDLE HYPO 25X5/8 SAFETYGLIDE (NEEDLE) IMPLANT
NS IRRIG 1000ML POUR BTL (IV SOLUTION) ×3 IMPLANT
PACK C SECTION WH (CUSTOM PROCEDURE TRAY) ×3 IMPLANT
PAD ABD 7.5X8 STRL (GAUZE/BANDAGES/DRESSINGS) ×3 IMPLANT
RETRACTOR WND ALEXIS 25 LRG (MISCELLANEOUS) ×2 IMPLANT
RETRACTOR WOUND ALXS 34CM XLRG (MISCELLANEOUS) IMPLANT
RTRCTR WOUND ALEXIS 25CM LRG (MISCELLANEOUS) ×3
RTRCTR WOUND ALEXIS 34CM XLRG (MISCELLANEOUS)
SLEEVE SCD COMPRESS KNEE MED (MISCELLANEOUS) IMPLANT
SPONGE GAUZE 4X4 12PLY (GAUZE/BANDAGES/DRESSINGS) ×3 IMPLANT
STAPLER VISISTAT 35W (STAPLE) ×3 IMPLANT
SUT CHROMIC 3 0 SH 27 (SUTURE) ×15 IMPLANT
SUT MNCRL 0 VIOLET CTX 36 (SUTURE) ×6 IMPLANT
SUT MON AB 2-0 CT1 27 (SUTURE) ×6 IMPLANT
SUT MONOCRYL 0 CTX 36 (SUTURE) ×3
SUT PDS AB 0 CTX 60 (SUTURE) ×6 IMPLANT
SUT PLAIN 0 NONE (SUTURE) IMPLANT
SUT PLAIN 2 0 XLH (SUTURE) ×6 IMPLANT
TAPE CLOTH SURG 4X10 WHT LF (GAUZE/BANDAGES/DRESSINGS) ×3 IMPLANT
TOWEL OR 17X24 6PK STRL BLUE (TOWEL DISPOSABLE) ×6 IMPLANT
TRAY FOLEY CATH 14FR (SET/KITS/TRAYS/PACK) ×3 IMPLANT
WATER STERILE IRR 1000ML POUR (IV SOLUTION) ×3 IMPLANT

## 2011-12-13 NOTE — OR Nursing (Signed)
12-13-11 Filshie Cllip implanted @ left fallopian tube in OR suite per Dr. Dareen Piano. Serial # [id number]. Exp. Date 02-11-14. Right fallopian tube not present.

## 2011-12-13 NOTE — Anesthesia Postprocedure Evaluation (Signed)
  Anesthesia Post-op Note  Patient: Sheryl Mcclure  Procedure(s) Performed: Procedure(s) (LRB): CESAREAN SECTION WITH BILATERAL TUBAL LIGATION (Bilateral)  Patient is awake, responsive, moving her legs, and has signs of resolution of her numbness. Pain and nausea are reasonably well controlled. Vital signs are stable and clinically acceptable. Oxygen saturation is clinically acceptable. There are no apparent anesthetic complications at this time. Patient is ready for discharge.

## 2011-12-13 NOTE — Anesthesia Preprocedure Evaluation (Addendum)
Anesthesia Evaluation  Patient identified by MRN, date of birth, ID band Patient awake    Reviewed: Allergy & Precautions, H&P , Patient's Chart, lab work & pertinent test results  Airway Mallampati: III TM Distance: >3 FB Neck ROM: full    Dental No notable dental hx.    Pulmonary  breath sounds clear to auscultation  Pulmonary exam normal       Cardiovascular Exercise Tolerance: Good hypertension, Rhythm:regular Rate:Normal     Neuro/Psych    GI/Hepatic   Endo/Other  Morbid obesity  Renal/GU      Musculoskeletal   Abdominal   Peds  Hematology   Anesthesia Other Findings   Reproductive/Obstetrics                           Anesthesia Physical Anesthesia Plan  ASA: III  Anesthesia Plan: Spinal   Post-op Pain Management:    Induction:   Airway Management Planned:   Additional Equipment:   Intra-op Plan:   Post-operative Plan:   Informed Consent: I have reviewed the patients History and Physical, chart, labs and discussed the procedure including the risks, benefits and alternatives for the proposed anesthesia with the patient or authorized representative who has indicated his/her understanding and acceptance.   Dental Advisory Given  Plan Discussed with: CRNA  Anesthesia Plan Comments: (Lab work confirmed with CRNA in room. Platelets okay. Discussed spinal anesthetic, and patient consents to the procedure:  included risk of possible headache,backache, failed block, allergic reaction, and nerve injury. This patient was asked if she had any questions or concerns before the procedure started. )        Anesthesia Quick Evaluation

## 2011-12-13 NOTE — H&P (Signed)
Pt is a 41 year old obese black female who presents for repeat c/s and BTL. Pt has h.o. 2 prior svd's.  Pt was willing to attempt a VBAC but when she presented for this she was breech. Pt did not want a version, Pt desires a BTL. She understands the risk and failure rate. Pt has AMA. She declined testing. PMHx: see hollister. PE: Morbidly obese BF in NAD         Transverse lie today.        FHTs: 140's  IMP/ IUP at 41 weeks, h.o. C/S, AMA, Desires BTL Plan/ proceed with Repeat C/S and BTL

## 2011-12-13 NOTE — Anesthesia Procedure Notes (Signed)
Spinal  Patient location during procedure: OR Preanesthetic Checklist Completed: patient identified, site marked, surgical consent, pre-op evaluation, timeout performed, IV checked, risks and benefits discussed and monitors and equipment checked Spinal Block Patient position: sitting Prep: DuraPrep Patient monitoring: cardiac monitor, continuous pulse ox, blood pressure and heart rate Approach: midline Location: L3-4 Injection technique: catheter Needle Needle type: Tuohy and Sprotte  Needle gauge: 24 G Needle length: 12.7 cm Needle insertion depth: 9 cm Catheter type: closed end flexible Catheter size: 19 g Catheter at skin depth: 19 cm Assessment Sensory level: T3 Additional Notes Spinal Dosage in OR  Bupivicaine ml       1.7 PFMS04   mcg        150 Fentanyl mcg            25

## 2011-12-13 NOTE — Transfer of Care (Signed)
Immediate Anesthesia Transfer of Care Note  Patient: Sheryl Mcclure  Procedure(s) Performed: Procedure(s) (LRB): CESAREAN SECTION WITH BILATERAL TUBAL LIGATION (Bilateral)  Patient Location: PACU  Anesthesia Type: Spinal and Epidural  Level of Consciousness: awake, alert  and oriented  Airway & Oxygen Therapy: Patient Spontanous Breathing  Post-op Assessment: Report given to PACU RN and Post -op Vital signs reviewed and stable  Post vital signs: Reviewed  Complications: No apparent anesthesia complications

## 2011-12-14 LAB — CBC
HCT: 25.9 % — ABNORMAL LOW (ref 36.0–46.0)
MCHC: 34 g/dL (ref 30.0–36.0)
RDW: 17.1 % — ABNORMAL HIGH (ref 11.5–15.5)
WBC: 11.7 10*3/uL — ABNORMAL HIGH (ref 4.0–10.5)

## 2011-12-14 NOTE — Progress Notes (Signed)
POD#1 Pt without c/o. Denies any nausea, emesis. Has not heard bowel sounds or had flatus. Pt was given clear liquids on the floor and states that she tolerated this. Pt was to be NPO. Lochia-wnl VSSAF CBC-wnl PE:  Abd- non-distended, no bowel sounds, appropriately tender, dressing dry. Exts-wnl IMP/ stable         No bowel sounds yet Plan/ Will make pt NPO until bowel sounds present.

## 2011-12-14 NOTE — Progress Notes (Signed)
Pt stated she passed flatus.   She is tolerating PO clears and some meds without nausea or vomiting. Her BS sound very active to me now.   Continue clears.

## 2011-12-14 NOTE — Progress Notes (Signed)
  Patient is not eating, ambulating, voiding.  Pain control is good.  Filed Vitals:   12/13/11 2237 12/14/11 0030 12/14/11 0230 12/14/11 0615  BP: 138/68 104/63 97/58 93/59   Pulse: 103 88 79 76  Temp:  98.3 F (36.8 C) 98.3 F (36.8 C) 97.7 F (36.5 C)  TempSrc:  Oral Oral Oral  Resp: 20 20 20 20   Weight:      SpO2: 99% 98% 98% 97%    lungs:   clear to auscultation cor:    RRR Abdomen:  soft, appropriate tenderness, incisions intact and without erythema or exudate ex:    no cords   Lab Results  Component Value Date   WBC 11.7* 12/14/2011   HGB 8.8* 12/14/2011   HCT 25.9* 12/14/2011   MCV 79.0 12/14/2011   PLT 265 12/14/2011     O+/RI  A/P    Post operative day 1.  Pt had a small enterotomy with a repair.  NPO today until bowel sounds;consider clears then.  Routine post op and postpartum care.  Mother desires circ- all risks d/w her.

## 2011-12-14 NOTE — Op Note (Signed)
NAMEZAVANNAH, DEBLOIS NO.:  [id number]  MEDICAL RECORD NO.:  [id number]  LOCATION:  9123                          FACILITY:  [facility]  PHYSICIAN:  Malva Limes, M.D.    DATE OF BIRTH:  1970-10-14  DATE OF PROCEDURE:  12/13/2011 DATE OF DISCHARGE:                              OPERATIVE REPORT   PREOPERATIVE DIAGNOSES: 1. Intrauterine pregnancy at 34 weeks estimated gestational age. 2. History of prior cesarean section. 3. Transverse lie. 4. The patient desires repeat cesarean section. 5. The patient desires permanent sterilization.  POSTOPERATIVE DIAGNOSES: 1. Intrauterine pregnancy at 4 weeks estimated gestational age. 2.     History of prior cesarean section. 2. Transverse lie. 3. The patient desires repeat cesarean section. 4. The patient desires permanent sterilization. 5. Extensive small bowel adhesions.  PROCEDURE: 1. Repeat low transverse cesarean section. 2. Left tubal ligation. 3. Repair of small bowel enterotomy. 4. Lysis of adhesions.  SURGEON:  Malva Limes, M.D.  ASSISTANT:  Luvenia Redden, M.D.  ANESTHESIA:  Spinal antibiotics, Ancef 2 g.  ESTIMATED BLOOD LOSS:  900 mL.  SPECIMENS:  None.  COMPLICATIONS:  A 3 mm enterotomy was made on the antimesenteric surface of the small bowel, which was then adherent to the anterior abdominal wall near the midline incision.  PROCEDURE:  The patient was taken to the operating room, where a spinal anesthetic was administered without difficulty.  She was then prepped and draped in the usual fashion for this procedure.  The patient had two previous vertical skin incisions.  Once an adequate level was reached, the patient had an incision made through the previous scar.  The patient had approximately 6-7 inches of adipose tissue.  When the fascia was reached, a small incision was made and the fascia was opened superiorly and inferiorly.  Parietal peritoneum was then entered with Metzenbaum scissors.   The lower portion of the incision appeared to be free of any bowel, however, the upper half of the incision,, up to the level of the umbilicus and superior had extensive adhesions involving the small bowel to the anterior abdominal wall.  At this point, lysis of adhesions was undertaken to take the small bowel down.  During this, 1 loop was very densely adherent to the anterior abdominal wall.  After the bowel was freed, there was noted to be a small 3 mm area on the antimesenteric surface, which had been breached.  Small amount of contents was spilled into the incision.  At this point, copious irrigation was performed and the enterotomy repaired with 3-0 chromic in a running fashion.  The area of repair was approximately a cm to a cm and a half.  This area was oversewn with 2nd layer of 3-0 chromic in a running fashion.  The incision was closed transverse to the bowel.  Once this was accomplished, the lumen was checked and felt to be very adequate.  No leakage was seen to be coming through the repair with some pressure on the incision.  At this point, the lower uterine segment was opened with a knife.  On entering the uterine cavity, the amniotic sac was opened with an Allis.  Meconium fluid  was noted.  The baby was then delivered with a vacuum extractor in the vertex presentation.  On delivery of the head, the oropharynx and nostrils were bulb suctioned.  The remaining infant was delivered.  The cord was doubly clamped and cut and the infant was handed to the awaiting NICU team.  Placenta was then manually removed.  The uterus was exteriorized.  The uterus had no adhesions on it.  The uterine cavity was wiped with a wet lap.  The uterine incision was closed using 0 Monocryl suture in a running locking fashion.  This was a 1 layer closure.  On examination of the adnexa, the patient had a absent right adnexa from her RSO. She had a mucinous cystadenoma removed.  The left fallopian tube and  ovary appeared to be normal.  A Filshie clip was placed in the isthmic portion of fallopian tube.  The entire tube appeared to be within the clasp.  The clasp appeared to be tightly closed and the clip appeared to be placed perpendicular to the tube.  At this point, the uterus was placed back in the abdominal cavity.  Hemostasis was checked and felt to be adequate. The bowel was then run again and no other overt areas of injury were noted.  The location of the enterotomy was covered with Interceed and packed away.  The abdominal incision was then closed using 0 PDS in a running Smead-Jones fashion.  This was started at the top and the bottom, and then tied in the midline.  The subcuticular tissue was then irrigated and closed with interrupted 2-0 plain gut suture.  Stainless steel clips were then used to close the skin.  The patient tolerated the procedure well.  She was taken to recovery room in stable condition. Instrument and lap counts were correct x3.          ______________________________ Malva Limes, M.D.     MA/MEDQ  D:  12/13/2011  T:  12/14/2011  Job:  [id number]

## 2011-12-14 NOTE — Anesthesia Postprocedure Evaluation (Signed)
  Anesthesia Post-op Note  Patient: Sheryl Mcclure  Procedure(s) Performed: Procedure(s) (LRB): CESAREAN SECTION WITH BILATERAL TUBAL LIGATION (Bilateral)  Patient Location: Mother/Baby  Anesthesia Type: Spinal and Epidural  Level of Consciousness: awake  Airway and Oxygen Therapy: Patient Spontanous Breathing  Post-op Pain: none  Post-op Assessment: Patient's Cardiovascular Status Stable, Respiratory Function Stable, Patent Airway, No signs of Nausea or vomiting, NAUSEA AND VOMITING PRESENT, Adequate PO intake, Pain level controlled, No headache, No backache, No residual numbness and No residual motor weakness  Post-op Vital Signs: Reviewed and stable  Complications: No apparent anesthesia complications

## 2011-12-14 NOTE — Addendum Note (Signed)
Addendum  created 12/14/11 0807 by Suella Grove, CRNA   Modules edited:Notes Section

## 2011-12-15 NOTE — Progress Notes (Signed)
Patient ID: Sheryl Mcclure, female   DOB: 03/02/1971, 41 y.o.   MRN: [id number]  CTSP for bleeding from her incision after bandage removable.  There appears to be an adherent clot on the skin edge approximately 2-3cm from the top of the incision.  The gauze covering the area was partially soaked with blood.  There is no active bleeding from the incision and no blood can be expressed from the incision.  This area of the incision is redressed with multiple 4x4 gauze and a non-adherent cover.    Otherwise the patient is doing well and reports a peaceful day.

## 2011-12-15 NOTE — Progress Notes (Signed)
Subjective: Postpartum Day 2: Cesarean Delivery Patient reports tolerating clear liquids, passage of flatus.  Denies nausea or vomiting.    Objective: Vital signs in last 24 hours: Filed Vitals:   12/14/11 0950 12/14/11 1400 12/14/11 2043 12/15/11 0508  BP: 104/66 97/64 105/68 108/73  Pulse: 75 75 72 84  Temp: 98.2 F (36.8 C) 97.3 F (36.3 C) 98.4 F (36.9 C) 98.2 F (36.8 C)  TempSrc: Oral Oral Oral Oral  Resp: 20 20 20 18   Weight:      SpO2: 98% 99% 99%     Physical Exam:  General: alert, cooperative and appears stated age 41: appropriate Uterine Fundus: firm Abdomen: appropriately tender, ND, BS present in all 4 quadrants Incision: dressing still in place   [name] 12/14/11 0539  HGB 8.8*  HCT 25.9*    Assessment/Plan: 41 yo POD #2 morbidly obese AA female s/p repeat C/S via vertical incision with enterotomy d/t extensive adhesive disease. 1) Status post Cesarean section. Doing well postoperatively.  2) Experiencing return of bowel function will advance diet.   Sheryl Mcclure H. 12/15/2011, 9:38 AM

## 2011-12-15 NOTE — Progress Notes (Signed)
Dr Tenny Craw notified of bleeding around staples at incisional site-3/4 of 4x4 stained with blood.  Several 4x4 and abd applied to incisional area- Dr  Tenny Craw will come and see pt.

## 2011-12-16 MED ORDER — SENNOSIDES-DOCUSATE SODIUM 8.6-50 MG PO TABS
1.0000 | ORAL_TABLET | Freq: Every day | ORAL | Status: DC
  Administered 2011-12-16: 2 via ORAL

## 2011-12-16 NOTE — Progress Notes (Signed)
Patient is eating, ambulating, voiding, passing flatus.  Pain control is good.  Pt denies N/V. Denies CP/SOB. She has no complaints.  Filed Vitals:   12/15/11 0508 12/15/11 1420 12/15/11 1917 12/16/11 0700  BP: 108/73 98/64 111/69 144/76  Pulse: 84 73 80 87  Temp: 98.2 F (36.8 C) 98.3 F (36.8 C) 98.3 F (36.8 C) 97.6 F (36.4 C)  TempSrc: Oral Oral Oral Oral  Resp: 18 20 20 20   Weight:      SpO2:   96% 95%    [employer]: c/d/i, minimal serous spots on pad Ext: no CT  Lab Results  Component Value Date   WBC 11.7* 12/14/2011   HGB 8.8* 12/14/2011   HCT 25.9* 12/14/2011   MCV 79.0 12/14/2011   PLT 265 12/14/2011     O+/RI  A/P Post-op day #3 s/p R c/s complicated by enterotomy,repaired. Pt doing well.  Will observe one more day. Routine care.  Expect d/c 4/5.    Sheryl Mcclure

## 2011-12-17 MED ORDER — OXYCODONE-ACETAMINOPHEN 5-325 MG PO TABS: 1.0000 | ORAL_TABLET | ORAL | Status: AC | PRN

## 2011-12-17 MED ORDER — IBUPROFEN 600 MG PO TABS: 600.0000 mg | ORAL_TABLET | Freq: Four times a day (QID) | ORAL | Status: AC | PRN

## 2011-12-17 MED ORDER — BISACODYL 10 MG RE SUPP: 10.0000 mg | Freq: Once | RECTAL | Status: DC

## 2011-12-17 NOTE — Progress Notes (Signed)
POD#4 Pt without c/o. States that she has flatus, but no BM yet. Pt states that pain is minimal. VSSAF ABD- Non distended, +BS, incision healing well. IMP/ Doing well. Plan/ Will Discharge and RTO on Monday for staple removal.

## 2011-12-17 NOTE — Discharge Summary (Signed)
NAMEQUANIKA, SOLEM NO.:  [id number]  MEDICAL RECORD NO.:  [id number]  LOCATION:  9123                          FACILITY:  [facility]  PHYSICIAN:  Malva Limes, M.D.    DATE OF BIRTH:  05-Jul-1971  DATE OF ADMISSION:  12/13/2011 DATE OF DISCHARGE:  12/17/2011                              DISCHARGE SUMMARY   PRINCIPAL DISCHARGE DIAGNOSES: 1. Intrauterine pregnancy at term. 2. History of prior cesarean section. 3. The patient desires repeat cesarean section. 4. The patient desires permanent sterilization. 5. Extensive bowel adhesions.  PRINCIPLE PROCEDURES: 1. Repeat low-transverse cesarean section. 2. Tubal ligation. 3. Repair of small-bowel enterotomy.  HISTORY OF PRESENT ILLNESS:  Ms. Distel is a 41 year old black female, G5, P3-1-1-3 who presented to [facility] on December 13, 2011, for a repeat cesarean section and tubal ligation.  The patient did have a history of two prior spontaneous vaginal deliveries and was willing to attempt a VBAC; however, when she presented at 40+ weeks, the infant was breech presentation.  The patient did not want attempted external version.  On the day of surgery, the patient did have another ultrasound and was found to be in the transverse lie.  HOSPITAL COURSE:  The patient underwent a repeat low-transverse cesarean section and tubal ligation.  A complete description of this procedure can be found in dictated operative note.  During this procedure, the patient was found to have extensive small bowel adhesions to the anterior abdominal wall.  There was a 4-mm enterotomy made in the small bowel, which was repaired with a 2-layer closure.  During the patient's postop course, she had return of bowel function without complications. She remained afebrile.  Her white count was normal.  The patient was began on clear liquids on postop day #2 when bowel sounds were heard. Once flatus was noted, she was began on a regular diet.  She had  no evidence of any distention.  She had normal pain and tolerated the diet well.  The patient was discharged to home on postop day #4.  At that time, she was tolerating her diet, had adequate pain control, and incision appeared to be healing well.  She did not have a bowel movement, but was going to be given a Dulcolax suppository prior to discharge.  The patient will follow up in the office on Monday for staple removal.  She will be sent home with Percocet and Advil to take p.r.n.  She was told to call the office for any abdominal distention, pain, or temperature greater than 100.          ______________________________ Malva Limes, M.D.     MA/MEDQ  D:  12/17/2011  T:  12/17/2011  Job:  [id number]

## 2014-02-15 ENCOUNTER — Encounter: Payer: Self-pay | Admitting: *Deleted

## 2014-02-15 ENCOUNTER — Encounter: Payer: BC Managed Care – PPO | Attending: Obstetrics | Admitting: *Deleted

## 2014-02-15 DIAGNOSIS — Z6841 Body Mass Index (BMI) 40.0 and over, adult: Secondary | ICD-10-CM | POA: Insufficient documentation

## 2014-02-15 DIAGNOSIS — Z713 Dietary counseling and surveillance: Secondary | ICD-10-CM | POA: Insufficient documentation

## 2014-02-15 NOTE — Progress Notes (Signed)
Medical Nutrition Therapy:  Appt start time: 0800 end time:  0900.  Assessment:  Patient here today for weight managment. Patient reports that she has been more aware of her eating habits since February, eating 3 meals a day, eating less fast food, and choosing healthier snacks. She does not do regular, structured exercise, but she has an active job, working as a Merchandiser, retail at [employer]. She works a consistent schedule with regular breaks, and also has access to healthy foods for lunch and snacks. She has done Weight Watchers in the past with some success. She has lost about 23 pounds in the last 4 months (1.4 pounds a week). Her biggest issue with eating is craving sweets. Patient does think that tracking her diet regularly will help her stay on track.   MEDICATIONS: Reviewed   DIETARY INTAKE:   Usual eating pattern includes 3 meals and >/=3 snacks per day.  24-hr recall:  B ( AM): Boiled egg OR 1 packet of oatmeal  Snk ( AM): Nuts, fruit, vegetables, yogurt  L ( PM): Tuna salad and crackers, fruit Snk ( PM): Same D ( PM): Chili with crackers OR sandwich Snk ( PM): None or same as above Beverages: Water, occasional Gatorade  Usual physical activity: None, active job  Estimated energy needs: 1800 calories 225 g carbohydrates 113 g protein 50 g fat  Progress Towards Goal(s):  In progress.   Nutritional Diagnosis:  [place]ball-3.3 Overweight/obesity As related to excessive energy intake and physical inactivity.  As evidenced by BMI >30.    Intervention:  Nutrition counseling. We discussed strategies for weight loss, including balancing nutrients (carbs, protein, fat), portion control, healthy snacks, and exercise.   Goals:  1. 1-2 pounds weight loss per week. No goal weight set. Patient just wants to be healthy.  2. Monitor portion size of foods.  3. Use plate method for healthy meal planning, increasing intake of non-starchy vegetables.  4. Read food labels.  5. Continue to choose healthy  snacks, limiting intake of sweets/junk food to small portions infrequently.  6. Consider increasing activity outside of work, starting with one day on the weekend.   Handouts given during visit include:  Weight loss tips  Plate method  Food label handout  Monitoring/Evaluation:  Dietary intake, exercise, and body weight in 2 month(s).

## 2014-04-08 ENCOUNTER — Encounter: Payer: BC Managed Care – PPO | Attending: Obstetrics | Admitting: *Deleted

## 2014-04-08 ENCOUNTER — Encounter: Payer: Self-pay | Admitting: *Deleted

## 2014-04-08 DIAGNOSIS — Z713 Dietary counseling and surveillance: Secondary | ICD-10-CM | POA: Insufficient documentation

## 2014-04-08 DIAGNOSIS — Z6841 Body Mass Index (BMI) 40.0 and over, adult: Secondary | ICD-10-CM | POA: Insufficient documentation

## 2014-04-08 NOTE — Progress Notes (Signed)
Medical Nutrition Therapy:  Appt start time: 0800 end time:  0830.  Assessment:  Patient returns today for a follow up visit for weight management. Patient's weight is down to 307.8 pounds from 316.9 pounds on June (1.3 pounds per week). She reports that she has not been able to focus much on dietary changes due to recent deaths in the family, but she has still been monitoring portions size, reading food labels, and continuing to limit sweets/sweetened drinks. She is not currently exercising outside of work, but downloaded a pedometer app on her phone. She walks at least 10,000 steps daily at work alone. She also discussed wanting to get a gym membership and exercise 3 times a week with her daughter. She has not been tracking her diet, but would like to start doing so.   MEDICATIONS: See list   DIETARY INTAKE:   Usual eating pattern includes 3 meals and 2-3 snacks per day.  24-hr recall:  B ( AM): Boiled egg OR 1 packet of oatmeal  Snk ( AM): Nuts, fruit, vegetables, yogurt  L ( PM): Tuna salad and crackers, fruit  Snk ( PM): Same  D ( PM): Chicken, rice, broccoli Snk ( PM): None or same as above  Beverages: Water, occasional Gatorade  Usual physical activity: Active job, >/=10,000 steps daily  Estimated energy needs: 1800 calories 225 g carbohydrates 113 g protein 50 g fat  Progress Towards Goal(s):  In progress.   Nutritional Diagnosis:  [place]-3.3 Overweight/obesity As related to excessive energy intake and physical inactivity. As evidenced by BMI >30.    Intervention:  Nutrition counseling. We reviewed strategies for weight loss including continuing to monitor portions, eat balanced meals, label reading, healthy snacking, and exercise. We also reviewed online diet trackers.   Goals:  1. 1-2 pounds weight loss per week. No goal weight set. Patient just wants to be healthy.  2. Continue to monitor portion size of foods.  3. Use plate method for healthy meal planning, increasing  intake of non-starchy vegetables.  4. Continue to read food labels.  5. Continue to choose healthy snacks, limiting intake of sweets/junk food to small portions infrequently.  6. Consider increasing activity outside of work, walking on treadmill 3 days weekly with daughter.   Monitoring/Evaluation:  Dietary intake, exercise, and body weight prn.

## 2014-07-15 ENCOUNTER — Encounter: Payer: Self-pay | Admitting: *Deleted

## 2014-08-05 ENCOUNTER — Ambulatory Visit (INDEPENDENT_AMBULATORY_CARE_PROVIDER_SITE_OTHER): Payer: BC Managed Care – PPO | Admitting: Family Medicine

## 2014-08-05 ENCOUNTER — Encounter: Payer: Self-pay | Admitting: Family Medicine

## 2014-08-05 VITALS — BP 136/84 | HR 103 | Temp 98.2°F | Ht 67.0 in | Wt 298.6 lb

## 2014-08-05 DIAGNOSIS — E559 Vitamin D deficiency, unspecified: Secondary | ICD-10-CM

## 2014-08-05 DIAGNOSIS — M79672 Pain in left foot: Secondary | ICD-10-CM

## 2014-08-05 DIAGNOSIS — M79673 Pain in unspecified foot: Secondary | ICD-10-CM | POA: Insufficient documentation

## 2014-08-05 NOTE — Progress Notes (Signed)
   Subjective:   Sheryl Mcclure is a 43 y.o. female with a history of vitamin D deficiency, morbid obesity here to establish care.  1. Vitamin D deficiency: Patient reports low vitamin D lab results in May. Her previous PCP prescribed vitamin D for 4 months. She's finished the course and would like a follow-up lab tests.  2. Morbid obesity: Patient reports that she is started working out and has lost 45 pounds within the last 6 months.  3. Heel pain: Patient reports "stepping on it wrong "last week while working out. She noticed it for the weekend that her left heel was none and she had sharp pains in her foot. Is been getting better and she is able to walk around more today.  Review of Systems:  Per HPI. All other systems reviewed and are negative.   PMH, PSH, Medications, Allergies, and FmHx reviewed and updated in EMR.  Social History: Never smoker  Objective:  BP 136/84 mmHg  Pulse 103  Temp(Src) 98.2 F (36.8 C) (Oral)  Ht 5\' 7"  (1.702 m)  Wt 298 lb 9.6 oz (135.444 kg)  BMI 46.76 kg/m2  LMP 07/16/2014  Gen:  43 y.o. female in NAD HEENT: NCAT, MMM, EOMI, PERRL, anicteric sclerae CV: RRR, no MRG, no JVD Resp: Non-labored, CTAB, no wheezes noted Abd: Soft, NTND, BS present, no guarding or organomegaly Ext: WWP, no edema MSK: Full ROM, strength intact, TTP over plantar fascia, left heel Neuro: Alert and oriented, speech normal    Assessment:     Sheryl Mcclure is a 43 y.o. female here for establishing care, heel pain, morbid obesity, vitamin D deficiency.    Plan:     See problem list for problem-specific plans.   Shirlee LatchAngela Bacigalupo, MD PGY-1,  [facility] 08/05/2014  4:28 PM

## 2014-08-05 NOTE — Assessment & Plan Note (Signed)
Patient losing weight well with exercise. Continue with current routine.

## 2014-08-05 NOTE — Assessment & Plan Note (Signed)
Patient reports h/o Vitamin D deficiency s/p 4 months of Vitamin D supplementation - Check Vit D level today

## 2014-08-05 NOTE — Assessment & Plan Note (Signed)
Possibly related to plantar fascitis.  Will treat conservatively now, as it is already starting to get better on its own. Gave patient exercises for plantar fasciitis.

## 2014-08-05 NOTE — Patient Instructions (Signed)
It was nice to meet you today. Keep up the good work with exercising and weight loss.  We will check a vitamin D level today and I will call you or send you a letter regarding it soon.  Give your foot rest to continue to heal itself as it is. You can also try to run it over a water bottle likely talked about.   Plantar Fasciitis Plantar fasciitis is a common condition that causes foot pain. It is soreness (inflammation) of the band of tough fibrous tissue on the bottom of the foot that runs from the heel bone (calcaneus) to the ball of the foot. The cause of this soreness may be from excessive standing, poor fitting shoes, running on hard surfaces, being overweight, having an abnormal walk, or overuse (this is common in runners) of the painful foot or feet. It is also common in aerobic exercise dancers and ballet dancers. SYMPTOMS  Most people with plantar fasciitis complain of:  Severe pain in the morning on the bottom of their foot especially when taking the first steps out of bed. This pain recedes after a few minutes of walking.  Severe pain is experienced also during walking following a long period of inactivity.  Pain is worse when walking barefoot or up stairs DIAGNOSIS   Your caregiver will diagnose this condition by examining and feeling your foot.  Special tests such as X-rays of your foot, are usually not needed. PREVENTION   Consult a sports medicine professional before beginning a new exercise program.  Walking programs offer a good workout. With walking there is a lower chance of overuse injuries common to runners. There is less impact and less jarring of the joints.  Begin all new exercise programs slowly. If problems or pain develop, decrease the amount of time or distance until you are at a comfortable level.  Wear good shoes and replace them regularly.  Stretch your foot and the heel cords at the back of the ankle (Achilles tendon) both before and after  exercise.  Run or exercise on even surfaces that are not hard. For example, asphalt is better than pavement.  Do not run barefoot on hard surfaces.  If using a treadmill, vary the incline.  Do not continue to workout if you have foot or joint problems. Seek professional help if they do not improve. HOME CARE INSTRUCTIONS   Avoid activities that cause you pain until you recover.  Use ice or cold packs on the problem or painful areas after working out.  Only take over-the-counter or prescription medicines for pain, discomfort, or fever as directed by your caregiver.  Soft shoe inserts or athletic shoes with air or gel sole cushions may be helpful.  If problems continue or become more severe, consult a sports medicine caregiver or your own health care provider. Cortisone is a potent anti-inflammatory medication that may be injected into the painful area. You can discuss this treatment with your caregiver. MAKE SURE YOU:   Understand these instructions.  Will watch your condition.  Will get help right away if you are not doing well or get worse. Document Released: 05/25/2001 Document Revised: 11/22/2011 Document Reviewed: 07/24/2008 [facility] Patient Information 2015 [employer]. This information is not intended to replace advice given to you by your health care provider. Make sure you discuss any questions you have with your health care provider.

## 2014-08-06 LAB — VITAMIN D 25 HYDROXY (VIT D DEFICIENCY, FRACTURES): VIT D 25 HYDROXY: 20 ng/mL — AB (ref 30–100)

## 2014-08-07 ENCOUNTER — Telehealth: Payer: Self-pay | Admitting: Family Medicine

## 2014-08-07 DIAGNOSIS — E559 Vitamin D deficiency, unspecified: Secondary | ICD-10-CM

## 2014-08-07 MED ORDER — VITAMIN D (ERGOCALCIFEROL) 1.25 MG (50000 UNIT) PO CAPS: 50000.0000 [IU] | ORAL_CAPSULE | ORAL | Status: DC

## 2014-08-07 NOTE — Telephone Encounter (Signed)
Will prescribe Vitamin D weekly x4 months.  Will recheck Vitamin D level after that time.  Shirlee LatchAngela Bacigalupo, MD, MPH PGY-1,  [facility] 08/07/2014 2:03 PM

## 2014-08-07 NOTE — Telephone Encounter (Signed)
Encounter opened in wrong context.  See next telephone encounter.

## 2014-08-12 NOTE — Telephone Encounter (Signed)
Tried calling, mobile and home numbers, both lines ring once then immediately goes to a busy signal.

## 2014-10-03 ENCOUNTER — Encounter: Payer: Self-pay | Admitting: Family Medicine

## 2014-10-03 ENCOUNTER — Ambulatory Visit (INDEPENDENT_AMBULATORY_CARE_PROVIDER_SITE_OTHER): Payer: BLUE CROSS/BLUE SHIELD | Admitting: Family Medicine

## 2014-10-03 VITALS — BP 143/78 | HR 79 | Temp 98.1°F | Ht 67.0 in | Wt 306.7 lb

## 2014-10-03 DIAGNOSIS — R300 Dysuria: Secondary | ICD-10-CM | POA: Diagnosis not present

## 2014-10-03 LAB — POCT UA - MICROSCOPIC ONLY

## 2014-10-03 LAB — POCT URINALYSIS DIPSTICK
Bilirubin, UA: NEGATIVE
GLUCOSE UA: NEGATIVE
Ketones, UA: NEGATIVE
NITRITE UA: NEGATIVE
Protein, UA: NEGATIVE
Spec Grav, UA: 1.015
Urobilinogen, UA: 1
pH, UA: 7

## 2014-10-03 MED ORDER — CEPHALEXIN 500 MG PO CAPS
500.0000 mg | ORAL_CAPSULE | Freq: Three times a day (TID) | ORAL | Status: DC
Start: 2014-10-03 — End: 2015-03-19

## 2014-10-03 MED ORDER — PHENAZOPYRIDINE HCL 200 MG PO TABS: 200.0000 mg | ORAL_TABLET | Freq: Three times a day (TID) | ORAL | Status: AC

## 2014-10-03 NOTE — Patient Instructions (Signed)
Phenazopyridine tablets What is this medicine? PHENAZOPYRIDINE (fen az oh PEER i deen) is a pain reliever. It is used to stop the pain, burning, or discomfort caused by infection or irritation of the urinary tract. This medicine is not an antibiotic. It will not cure a urinary tract infection. This medicine may be used for other purposes; ask your health care provider or pharmacist if you have questions. COMMON BRAND NAME(S): AZO, Azo-100, Azo-Gesic, Azo-Septic, Azo-Standard, Phenazo, Prodium, Pyridium, Urinary Analgesic, Uristat What should I tell my health care provider before I take this medicine? They need to know if you have any of these conditions: -glucose-6-phosphate dehydrogenase (G6PD) deficiency -kidney disease -an unusual or allergic reaction to phenazopyridine, other medicines, foods, dyes, or preservatives -pregnant or trying to get pregnant -breast-feeding How should I use this medicine? Take this medicine by mouth with a glass of water. Follow the directions on the prescription label. Take after meals. Take your doses at regular intervals. Do not take your medicine more often than directed. Do not skip doses or stop your medicine early even if you feel better. Do not stop taking except on your doctor's advice. Talk to your pediatrician regarding the use of this medicine in children. Special care may be needed. Overdosage: If you think you have taken too much of this medicine contact a poison control center or emergency room at once. NOTE: This medicine is only for you. Do not share this medicine with others. What if I miss a dose? If you miss a dose, take it as soon as you can. If it is almost time for your next dose, take only that dose. Do not take double or extra doses. What may interact with this medicine? Interactions are not expected. This list may not describe all possible interactions. Give your health care provider a list of all the medicines, herbs, non-prescription  drugs, or dietary supplements you use. Also tell them if you smoke, drink alcohol, or use illegal drugs. Some items may interact with your medicine. What should I watch for while using this medicine? Tell your doctor or health care professional if your symptoms do not improve or if they get worse. This medicine colors body fluids red. This effect is harmless and will go away after you are done taking the medicine. It will change urine to an dark orange or red color. The red color may stain clothing. Soft contact lenses may become permanently stained. It is best not to wear soft contact lenses while taking this medicine. If you are diabetic you may get a false positive result for sugar in your urine. Talk to your health care provider. What side effects may I notice from receiving this medicine? Side effects that you should report to your doctor or health care professional as soon as possible: -allergic reactions like skin rash, itching or hives, swelling of the face, lips, or tongue -blue or purple color of the skin -difficulty breathing -fever -less urine -unusual bleeding, bruising -unusual tired, weak -vomiting -yellowing of the eyes or skin Side effects that usually do not require medical attention (report to your doctor or health care professional if they continue or are bothersome): -dark urine -headache -stomach upset This list may not describe all possible side effects. Call your doctor for medical advice about side effects. You may report side effects to FDA at 1-800-FDA-1088. Where should I keep my medicine? Keep out of the reach of children. Store at room temperature between 15 and 30 degrees C (59 and 86   degrees F). Protect from light and moisture. Throw away any unused medicine after the expiration date. NOTE: This sheet is a summary. It may not cover all possible information. If you have questions about this medicine, talk to your doctor, pharmacist, or health care provider.   2015, Elsevier/Gold Standard. (2008-03-28 11:04:07)  

## 2014-10-03 NOTE — Progress Notes (Signed)
  ZOX:[id number]:Bacigalupo, Marylene LandAngela, MD Chief Complaint  Patient presents with  . Cystitis    Current Issues:  Presents with 2 days of urinary urgency, urinary frequency and low back pain Associated symptoms include:  cloudy urine.  Denies CVA TTP, Fever, chills, sweats, abdominal pain, N/V/D.   Prior to Admission medications   Medication Sig Start Date End Date Taking? Authorizing Provider  Vitamin D, Ergocalciferol, (DRISDOL) 50000 UNITS CAPS capsule Take 1 capsule (50,000 Units total) by mouth every 7 (seven) days. 08/07/14   Shirlee LatchAngela Bacigalupo, MD    Review of Systems:Per HPI   PE:  BP 143/78 mmHg  Pulse 79  Temp(Src) 98.1 F (36.7 C) (Oral)  Ht 5\' 7"  (1.702 m)  Wt 306 lb 11.2 oz (139.118 kg)  BMI 48.02 kg/m2  LMP 09/15/2014 (Approximate) Constitutional NAD  Heart RRR Lungs CTA Back No CVA TTP Abdomen Soft/NT/ND, NABS  Results for orders placed or performed in visit on 10/03/14  Urinalysis Dipstick  Result Value Ref Range   Color, UA YELLOW    Clarity, UA CLEAR    Glucose, UA NEG    Bilirubin, UA NEG    Ketones, UA NEG    Spec Grav, UA 1.015    Blood, UA MODERATE    pH, UA 7.0    Protein, UA NEG    Urobilinogen, UA 1.0    Nitrite, UA NEG    Leukocytes, UA moderate (2+)   POCT UA - Microscopic Only  Result Value Ref Range   WBC, Ur, HPF, POC 1-5    RBC, urine, microscopic 1-3    Bacteria, U Microscopic 2+    Epithelial cells, urine per micros 5-15     Assessment and Plan:  1. Acute cystitis with hematuria - Urinalysis Dipstick - POCT UA - Microscopic Only - Keflex 500 mg TID x 7 days - Consider repeat UA in 2-3 weeks for clearance of hematuria per PCP

## 2014-11-20 ENCOUNTER — Ambulatory Visit (INDEPENDENT_AMBULATORY_CARE_PROVIDER_SITE_OTHER): Payer: BLUE CROSS/BLUE SHIELD | Admitting: Family Medicine

## 2014-11-20 ENCOUNTER — Encounter: Payer: Self-pay | Admitting: Family Medicine

## 2014-11-20 VITALS — BP 125/82 | HR 81 | Ht 67.0 in | Wt 303.0 lb

## 2014-11-20 DIAGNOSIS — R319 Hematuria, unspecified: Secondary | ICD-10-CM | POA: Insufficient documentation

## 2014-11-20 DIAGNOSIS — R300 Dysuria: Secondary | ICD-10-CM | POA: Diagnosis not present

## 2014-11-20 DIAGNOSIS — E559 Vitamin D deficiency, unspecified: Secondary | ICD-10-CM

## 2014-11-20 LAB — POCT UA - MICROSCOPIC ONLY: Epithelial cells, urine per micros: <5

## 2014-11-20 LAB — POCT URINALYSIS DIPSTICK
Bilirubin, UA: NEGATIVE
Glucose, UA: NEGATIVE
KETONES UA: NEGATIVE
LEUKOCYTES UA: NEGATIVE
Nitrite, UA: NEGATIVE
Protein, UA: NEGATIVE
Spec Grav, UA: 1.015
UROBILINOGEN UA: 0.2
pH, UA: 6

## 2014-11-20 LAB — BASIC METABOLIC PANEL
BUN: 14 mg/dL (ref 6–23)
CHLORIDE: 105 meq/L (ref 96–112)
CO2: 27 meq/L (ref 19–32)
CREATININE: 0.47 mg/dL — AB (ref 0.50–1.10)
Calcium: 9.1 mg/dL (ref 8.4–10.5)
Glucose, Bld: 82 mg/dL (ref 70–99)
Potassium: 4.2 mEq/L (ref 3.5–5.3)
Sodium: 140 mEq/L (ref 135–145)

## 2014-11-20 NOTE — Assessment & Plan Note (Addendum)
Previously treated for cystitis with hematuria in 10/03/14 with Keflex Could be related to recurrent cystitis vs nephrolithiasis vs interstitial cystitis UA with large RBCs, microscopy pending Will send UCx - and treat if positive If UCx negative, will get Renal CT - stone protocol and refer to urology for likely cystoscopy. Will call patient when results available.

## 2014-11-20 NOTE — Progress Notes (Signed)
   Subjective:   Sheryl Mcclure is a 44 y.o. female with a history of morbid obesity, vitamin D deficiency here for f/u of vitamin D levels and dysuria.  Vitamin D deficiency: - Patient has been taking 50,000 units D3 weekly x4 months (25-OH level 20 in 07/2014) - Patient had been taking 50,000 units weekly for 4 months prior to 07/2014 as well - feels fatigued intermittently - hardly any sun exposure  Morbid obesity: - still working on diet and exercise - weight has been up and down lately  Dysuria: - incr urinary frequency, back pain and suprapubic pain with urination - seen in clinic 1/21 for similar symptoms - symptoms resolved and then came back - finished full 7d course of keflex - denies hematuria, burning with urination, fevers  Review of Systems:  Per HPI. All other systems reviewed and are negative.   PMH, PSH, Medications, Allergies, and FmHx reviewed and updated in EMR.  Social History: Never smoker  Objective:  BP 125/82 mmHg  Pulse 81  Ht 5\' 7"  (1.702 m)  Wt 303 lb (137.44 kg)  BMI 47.45 kg/m2  LMP 11/14/2014  Gen:  44 y.o. female in NAD HEENT: NCAT, MMM CV: RRR, no MRG Resp: Non-labored, CTAB, no wheezes noted Abd: Soft, NTND, BS present, no guarding or organomegaly. No back tenderness, no CVA tenderness Ext: WWP, no edema Neuro: Alert and oriented, speech normal     Assessment:     Sheryl Mcclure is a 44 y.o. female here for Vitamin D defic f/u, hematuria.     Plan:     See problem list for problem-specific plans.   Erasmo DownerAngela M Bacigalupo, MD, MPH PGY-1,  [facility] 11/20/2014  1:48 PM

## 2014-11-20 NOTE — Patient Instructions (Signed)
It was nice to see you again today.  I will let you know about your vitamin D level and what to do about the medication when available.  I am going to send your urine for culture (to see if it grows bacteria).  We may need you to see urology if it does not look infected.  I will call with these results.  Take care, Dr. Leonard SchwartzB  Vitamin D Deficiency Vitamin D is an important vitamin that your body needs. Having too little of it in your body is called a deficiency. A very bad deficiency can make your bones soft and can cause a condition called rickets.  Vitamin D is important to your body for different reasons, such as:   It helps your body absorb 2 minerals called calcium and phosphorus.  It helps make your bones healthy.  It may prevent some diseases, such as diabetes and multiple sclerosis.  It helps your muscles and heart. You can get vitamin D in several ways. It is a natural part of some foods. The vitamin is also added to some dairy products and cereals. Some people take vitamin D supplements. Also, your body makes vitamin D when you are in the sun. It changes the sun's rays into a form of the vitamin that your body can use. CAUSES   Not eating enough foods that contain vitamin D.  Not getting enough sunlight.  Having certain digestive system diseases that make it hard to absorb vitamin D. These diseases include Crohn's disease, chronic pancreatitis, and cystic fibrosis.  Having a surgery in which part of the stomach or small intestine is removed.  Being obese. Fat cells pull vitamin D out of your blood. That means that obese people may not have enough vitamin D left in their blood and in other body tissues.  Having chronic kidney or liver disease. RISK FACTORS Risk factors are things that make you more likely to develop a vitamin D deficiency. They include:  Being older.  Not being able to get outside very much.  Living in a nursing home.  Having had broken bones.  Having  weak or thin bones (osteoporosis).  Having a disease or condition that changes how your body absorbs vitamin D.  Having dark skin.  Some medicines such as seizure medicines or steroids.  Being overweight or obese. SYMPTOMS Mild cases of vitamin D deficiency may not have any symptoms. If you have a very bad case, symptoms may include:  Bone pain.  Muscle pain.  Falling often.  Broken bones caused by a minor injury, due to osteoporosis. DIAGNOSIS A blood test is the best way to tell if you have a vitamin D deficiency. TREATMENT Vitamin D deficiency can be treated in different ways. Treatment for vitamin D deficiency depends on what is causing it. Options include:  Taking vitamin D supplements.  Taking a calcium supplement. Your caregiver will suggest what dose is best for you. HOME CARE INSTRUCTIONS  Take any supplements that your caregiver prescribes. Follow the directions carefully. Take only the suggested amount.  Have your blood tested 2 months after you start taking supplements.  Eat foods that contain vitamin D. Healthy choices include:  Fortified dairy products, cereals, or juices. Fortified means vitamin D has been added to the food. Check the label on the package to be sure.  Fatty fish like salmon or trout.  Eggs.  Oysters.  Do not use a tanning bed.  Keep your weight at a healthy level. Lose weight if  you need to.  Keep all follow-up appointments. Your caregiver will need to perform blood tests to make sure your vitamin D deficiency is going away. SEEK MEDICAL CARE IF:  You have any questions about your treatment.  You continue to have symptoms of vitamin D deficiency.  You have nausea or vomiting.  You are constipated.  You feel confused.  You have severe abdominal or back pain. MAKE SURE YOU:  Understand these instructions.  Will watch your condition.  Will get help right away if you are not doing well or get worse. Document Released:  11/22/2011 Document Revised: 12/25/2012 Document Reviewed: 11/22/2011 [facility] Patient Information 2015 [place], [place]. This information is not intended to replace advice given to you by your health care provider. Make sure you discuss any questions you have with your health care provider.   Hematuria Hematuria is blood in your urine. It can be caused by a bladder infection, kidney infection, prostate infection, kidney stone, or cancer of your urinary tract. Infections can usually be treated with medicine, and a kidney stone usually will pass through your urine. If neither of these is the cause of your hematuria, further workup to find out the reason may be needed. It is very important that you tell your health care provider about any blood you see in your urine, even if the blood stops without treatment or happens without causing pain. Blood in your urine that happens and then stops and then happens again can be a symptom of a very serious condition. Also, pain is not a symptom in the initial stages of many urinary cancers. HOME CARE INSTRUCTIONS   Drink lots of fluid, 3-4 quarts a day. If you have been diagnosed with an infection, cranberry juice is especially recommended, in addition to large amounts of water.  Avoid caffeine, tea, and carbonated beverages because they tend to irritate the bladder.  Avoid alcohol because it may irritate the prostate.  Take all medicines as directed by your health care provider.  If you were prescribed an antibiotic medicine, finish it all even if you start to feel better.  If you have been diagnosed with a kidney stone, follow your health care provider's instructions regarding straining your urine to catch the stone.  Empty your bladder often. Avoid holding urine for long periods of time.  After a bowel movement, women should cleanse front to back. Use each tissue only once.  Empty your bladder before and after sexual intercourse if you are a  female. SEEK MEDICAL CARE IF:  You develop back pain.  You have a fever.  You have a feeling of sickness in your stomach (nausea) or vomiting.  Your symptoms are not better in 3 days. Return sooner if you are getting worse. SEEK IMMEDIATE MEDICAL CARE IF:   You develop severe vomiting and are unable to keep the medicine down.  You develop severe back or abdominal pain despite taking your medicines.  You begin passing a large amount of blood or clots in your urine.  You feel extremely weak or faint, or you pass out. MAKE SURE YOU:   Understand these instructions.  Will watch your condition.  Will get help right away if you are not doing well or get worse. Document Released: 08/30/2005 Document Revised: 01/14/2014 Document Reviewed: 04/30/2013 [facility] Patient Information 2015 [place], [place]. This information is not intended to replace advice given to you by your health care provider. Make sure you discuss any questions you have with your health care provider.

## 2014-11-20 NOTE — Assessment & Plan Note (Signed)
Last Vit D 25-OH level 20 (07/2014) S/p once weekly supplementation with 50,000 units D3 x ___wks Recheck Vit D level today Encouraged patient to increase dietary intake of foods high in Vit D and get some sun exposure when possible If wnl, consider Vit D3 600-800 units daily to maintain adequate level

## 2014-11-20 NOTE — Assessment & Plan Note (Signed)
Encouraged patient to continue to work on diet and exercise for weight loss.

## 2014-11-21 ENCOUNTER — Telehealth: Payer: Self-pay | Admitting: Family Medicine

## 2014-11-21 DIAGNOSIS — E559 Vitamin D deficiency, unspecified: Secondary | ICD-10-CM

## 2014-11-21 LAB — VITAMIN D 25 HYDROXY (VIT D DEFICIENCY, FRACTURES): Vit D, 25-Hydroxy: 25 ng/mL — ABNORMAL LOW (ref 30–100)

## 2014-11-21 LAB — URINE CULTURE
Colony Count: NO GROWTH
Organism ID, Bacteria: NO GROWTH

## 2014-11-21 MED ORDER — CHOLECALCIFEROL 25 MCG (1000 UT) PO CAPS: 1000.0000 [IU] | ORAL_CAPSULE | Freq: Every day | ORAL | Status: AC

## 2014-11-21 NOTE — Telephone Encounter (Signed)
Patients electrolytes and kidney function wnl.  Vitamin D level still low - 25 (previously 20). Greater than 30 is normal.    I will switch to daily Vitamin D supplementation of a lower dose.  If she finds it cumbersome to take daily pill, we could switch to a slightly higher dose pill 2-3 times weekly in the future.  Erasmo DownerAngela M Arlanda Shiplett, MD, MPH PGY-1,  [facility] 11/21/2014 11:05 AM

## 2014-11-21 NOTE — Telephone Encounter (Signed)
Pt informed. Deseree Blount, CMA  

## 2014-11-22 ENCOUNTER — Telehealth: Payer: Self-pay | Admitting: Family Medicine

## 2014-11-22 DIAGNOSIS — R319 Hematuria, unspecified: Secondary | ICD-10-CM

## 2014-11-22 NOTE — Telephone Encounter (Signed)
Pt informed. tols her someone will call her back with appt times.Regnia Mathwig Bruna PotterBlount, CMA

## 2014-11-22 NOTE — Telephone Encounter (Signed)
Patient's urine culture shows no growth, despite persistent hematuria.  Patient and I discussed that if this was the case, she would need CT and referral to urology during last appt.  Erasmo DownerAngela M Penney Domanski, MD, MPH PGY-1,  [facility] 11/22/2014 1:45 PM

## 2014-11-26 ENCOUNTER — Telehealth: Payer: Self-pay | Admitting: *Deleted

## 2014-11-26 NOTE — Telephone Encounter (Signed)
Kaylie from [facility] called needing a pre-cert from [employer].  Pt has an appt 11/27/14 for CT Scan.  Please give her a call at [phone]. Clovis PuMartin, Shawan Corella L, RN

## 2014-11-26 NOTE — Telephone Encounter (Signed)
Pt informed of appt time. Deigo Alonso, CMA  

## 2014-11-26 NOTE — Telephone Encounter (Signed)
Called her back and gave her the precert # [id number]. Adrijana Haros Bruna PotterBlount, CMA

## 2014-11-27 ENCOUNTER — Ambulatory Visit (HOSPITAL_COMMUNITY)
Admission: RE | Admit: 2014-11-27 | Discharge: 2014-11-27 | Disposition: A | Discharge status: Home / Self Care | Payer: BLUE CROSS/BLUE SHIELD | Source: Ambulatory Visit | Attending: Family Medicine | Admitting: Family Medicine

## 2014-11-27 ENCOUNTER — Telehealth: Payer: Self-pay | Admitting: Family Medicine

## 2014-11-27 DIAGNOSIS — R319 Hematuria, unspecified: Secondary | ICD-10-CM | POA: Insufficient documentation

## 2014-11-27 DIAGNOSIS — M8530 Osteitis condensans, unspecified site: Secondary | ICD-10-CM | POA: Insufficient documentation

## 2014-11-27 NOTE — Telephone Encounter (Signed)
Reviewed results of CT with patient over the phone.  Results are benign.  Patient tearful on the phone voicing great appreciation for results.  Asked that she continue to follow up with PCP if symptoms continuing.  Patient voiced good understanding.  Ct Renal Stone Study  11/27/2014   CLINICAL DATA:  Hematuria  EXAM: CT ABDOMEN AND PELVIS WITHOUT CONTRAST  TECHNIQUE: Multidetector CT imaging of the abdomen and pelvis was performed following the standard protocol without IV contrast.  COMPARISON:  None.  FINDINGS: BODY WALL: No contributory findings.  LOWER CHEST: No contributory findings.  ABDOMEN/PELVIS:  Liver: No focal abnormality.  Biliary: No evidence of biliary obstruction or stone.  Pancreas: Unremarkable.  Spleen: Unremarkable.  Adrenals: Unremarkable.  Kidneys and ureters: No hydronephrosis or stone. 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 There is no asymmetric edema or renal vein expansion.  Bladder: Unremarkable.  Reproductive: Prominent uterine size, but no discrete mass. There is a tubal ligation clip on the left.  Bowel: No obstruction. Appendix not well visualized. No pericecal inflammation.  Retroperitoneum: No mass or adenopathy.  Peritoneum: No ascites or pneumoperitoneum.  Vascular: Benign-appearing sclerosis on both sides of the non eroded right sacroiliac joint. There is also mild osteitis pubis.  OSSEOUS: No acute abnormalities.  IMPRESSION: No urolithiasis or hydronephrosis.   Electronically Signed   By: Marnee SpringJonathon  Watts M.D.   On: 11/27/2014 12:06    Kailea Dannemiller M. Nadine CountsGottschalk, DO PGY-1, [facility]

## 2015-01-08 ENCOUNTER — Ambulatory Visit: Payer: BLUE CROSS/BLUE SHIELD | Admitting: Family Medicine

## 2015-01-22 ENCOUNTER — Encounter: Payer: Self-pay | Admitting: Family Medicine

## 2015-01-22 ENCOUNTER — Ambulatory Visit (INDEPENDENT_AMBULATORY_CARE_PROVIDER_SITE_OTHER): Payer: BLUE CROSS/BLUE SHIELD | Admitting: Family Medicine

## 2015-01-22 ENCOUNTER — Other Ambulatory Visit (HOSPITAL_COMMUNITY)
Admission: RE | Admit: 2015-01-22 | Discharge: 2015-01-22 | Disposition: A | Discharge status: Home / Self Care | Payer: BLUE CROSS/BLUE SHIELD | Source: Ambulatory Visit | Attending: Family Medicine | Admitting: Family Medicine

## 2015-01-22 VITALS — BP 128/84 | HR 80 | Temp 98.1°F | Ht 67.0 in | Wt 311.0 lb

## 2015-01-22 DIAGNOSIS — R358 Other polyuria: Secondary | ICD-10-CM | POA: Insufficient documentation

## 2015-01-22 DIAGNOSIS — Z113 Encounter for screening for infections with a predominantly sexual mode of transmission: Secondary | ICD-10-CM | POA: Diagnosis present

## 2015-01-22 DIAGNOSIS — Z1151 Encounter for screening for human papillomavirus (HPV): Secondary | ICD-10-CM | POA: Diagnosis present

## 2015-01-22 DIAGNOSIS — Z0000 Encounter for general adult medical examination without abnormal findings: Secondary | ICD-10-CM | POA: Insufficient documentation

## 2015-01-22 DIAGNOSIS — Z202 Contact with and (suspected) exposure to infections with a predominantly sexual mode of transmission: Secondary | ICD-10-CM | POA: Insufficient documentation

## 2015-01-22 DIAGNOSIS — Z01419 Encounter for gynecological examination (general) (routine) without abnormal findings: Secondary | ICD-10-CM | POA: Diagnosis present

## 2015-01-22 DIAGNOSIS — Z20828 Contact with and (suspected) exposure to other viral communicable diseases: Secondary | ICD-10-CM | POA: Insufficient documentation

## 2015-01-22 DIAGNOSIS — R319 Hematuria, unspecified: Secondary | ICD-10-CM | POA: Diagnosis not present

## 2015-01-22 DIAGNOSIS — R3589 Other polyuria: Secondary | ICD-10-CM | POA: Insufficient documentation

## 2015-01-22 DIAGNOSIS — Z124 Encounter for screening for malignant neoplasm of cervix: Secondary | ICD-10-CM

## 2015-01-22 LAB — LIPID PANEL
CHOLESTEROL: 156 mg/dL (ref 0–200)
HDL: 51 mg/dL (ref >=46)
LDL Cholesterol: 95 mg/dL (ref 0–99)
Total CHOL/HDL Ratio: 3.1 Ratio
Triglycerides: 52 mg/dL (ref <150)
VLDL: 10 mg/dL (ref 0–40)

## 2015-01-22 LAB — POCT WET PREP (WET MOUNT): CLUE CELLS WET PREP WHIFF POC: NEGATIVE

## 2015-01-22 LAB — POCT GLYCOSYLATED HEMOGLOBIN (HGB A1C): Hemoglobin A1C: 5.4

## 2015-01-22 NOTE — Assessment & Plan Note (Signed)
Pap smear today HIV, RPR, wet prep, GC/CT today

## 2015-01-22 NOTE — Assessment & Plan Note (Signed)
Could be related to hematuria.  Doubt UTI given lack of symptoms Hgb A1c today Referral to urology

## 2015-01-22 NOTE — Patient Instructions (Signed)
Sincerely again today. I'm referring him to urology for the blood in your urine. Someone will call you about making an appointment soon. I'm getting some labs to check for diabetes and high cholesterol. We are also checking for STDs and doing Pap smear today. Someone will call you or send you a letter with the results soon.  Take care, Dr. B  Things to do to keep yourself healthy  - Exercise at least 30-45 minutes a day, 3-4 days a week.  - Eat a low-fat diet with lots of fruits and vegetables, up to 7-9 servings per day.  - Seatbelts can save your life. Wear them always.  - Smoke detectors on every level of your home, check batteries every year.  - Eye Doctor - have an eye exam every 1-2 years  - Safe sex - if you may be exposed to STDs, use a condom.  - Alcohol -  If you drink, do it moderately, less than 2 drinks per day.  - Health Care Power of Attorney. Choose someone to speak for you if you are not able.  - Depression is common in our stressful world.If you're feeling down or losing interest in things you normally enjoy, please come in for a visit.  - Violence - If anyone is threatening or hurting you, please call immediately.

## 2015-01-22 NOTE — Assessment & Plan Note (Signed)
Previously treated for cystitis with hematuria on 10/03/14 was 7 a course of Keflex Symptoms have persisted and last UA with large RBCs Nephrolithiasis ruled out with normal renal CT stone protocol Concern for interstitial cystitis Referral to urology for possible cystoscopy

## 2015-01-22 NOTE — Progress Notes (Signed)
44 y.o. year old female presents for well woman/preventative visit and annual GYN examination.  Acute Concerns: Polyuria:  - Patient reports she is "peeing a lot" - 10 times per day (seems to be a lot more than her baseline, but cannot clarify how many times per day she usually goes) - off and on for a few weeks - no dysuria, no visible hematuria, no suprapubic pain with urination, fevers, polydipsia  Would also like STI testing during exam.  Diet: Trying to eat better  Exercise: hasn't been doing regularly exercise, because of plantar fascitiis ([place] [facility]  Sexual/Birth History: currently sexually active, 1 female partner, [id number], s/p BTL  POA/Living Will: none  Social:  History   Social History  . Marital Status: Married    Spouse Name: N/A  . Number of Children: N/A  . Years of Education: N/A   Social History Main Topics  . Smoking status: Never Smoker   . Smokeless tobacco: Never Used  . Alcohol Use: No  . Drug Use: No  . Sexual Activity: Yes    Birth Control/ Protection: None   Other Topics Concern  . Not on file   Social History Narrative    Immunization:  Tdap/TD: 12/2011  Influenza: will need in fall  Pneumococcal: n/a  Herpes Zoster: n/a  Cancer Screening:  Pap Smear: Will be completed today  Mammogram: would like to defer to age 44  Colonoscopy: n/a  Dexa: n/a  Physical Exam: Blood pressure 128/84, pulse 80, temperature 98.1 F (36.7 C), temperature source Oral, height 5\' 7"  (1.702 m), weight 311 lb (141.069 kg) GEN: WDWN, NAD HEENT: NCAT, MMM, EOMI CARDIAC: RRR, no m/r/g, 2+ DP pulses b/l RESP: CTAB, no w/r/c. Normal WOB  GU/GYN:Exam performed in the presence of a chaperone. GYN:  External genitalia within normal limits.  Vaginal mucosa pink, moist, normal rugae.  Nonfriable cervix without lesions, no discharge or bleeding noted on speculum exam.  Bimanual exam revealed normal, nongravid uterus.  No  cervical motion tenderness. No adnexal masses bilaterally.    EXT: WWP, no edema SKIN: no rashes  ASSESSMENT & PLAN: 44 y.o. female presents for annual well woman/preventative exam and GYN exam. Please see problem specific assessment and plan.     Erasmo DownerAngela M Bacigalupo, MD, MPH PGY-1,  [facility] 01/22/2015 1:32 PM

## 2015-01-22 NOTE — Assessment & Plan Note (Signed)
Encourage diet and exercise lipid panel and A1c today

## 2015-01-23 LAB — RPR

## 2015-01-23 LAB — HIV ANTIBODY (ROUTINE TESTING W REFLEX): HIV 1&2 Ab, 4th Generation: NONREACTIVE

## 2015-01-23 LAB — CYTOLOGY - PAP

## 2015-01-27 ENCOUNTER — Telehealth: Payer: Self-pay | Admitting: *Deleted

## 2015-01-27 NOTE — Telephone Encounter (Signed)
Error. Deseree Blount, CMA  

## 2015-01-27 NOTE — Telephone Encounter (Signed)
Pt informed. Deseree Blount, CMA  

## 2015-01-27 NOTE — Telephone Encounter (Signed)
-----   Message from Erasmo DownerAngela M Bacigalupo, MD sent at 01/27/2015 11:53 AM EDT ----- A1c, lipid panel wnl.  Wet prep, GC/CT, RPR, HIV negative, pap smear normal (will repeat in 5 years).  Could you call this patient and let her know that these were negative.   Erasmo DownerAngela M Bacigalupo, MD, MPH PGY-1,  [facility] 01/27/2015 11:53 AM

## 2015-03-10 ENCOUNTER — Encounter: Payer: Self-pay | Admitting: Podiatry

## 2015-03-10 ENCOUNTER — Ambulatory Visit (INDEPENDENT_AMBULATORY_CARE_PROVIDER_SITE_OTHER): Payer: BLUE CROSS/BLUE SHIELD

## 2015-03-10 ENCOUNTER — Ambulatory Visit (INDEPENDENT_AMBULATORY_CARE_PROVIDER_SITE_OTHER): Payer: BLUE CROSS/BLUE SHIELD | Admitting: Podiatry

## 2015-03-10 VITALS — BP 136/82 | HR 62 | Resp 15

## 2015-03-10 DIAGNOSIS — M79672 Pain in left foot: Secondary | ICD-10-CM

## 2015-03-10 DIAGNOSIS — M722 Plantar fascial fibromatosis: Secondary | ICD-10-CM

## 2015-03-10 MED ORDER — DICLOFENAC SODIUM 75 MG PO TBEC: 75.0000 mg | DELAYED_RELEASE_TABLET | Freq: Two times a day (BID) | ORAL | Status: AC

## 2015-03-10 MED ORDER — TRIAMCINOLONE ACETONIDE 10 MG/ML IJ SUSP
10.0000 mg | Freq: Once | INTRAMUSCULAR | Status: AC
  Administered 2015-03-10: 10 mg

## 2015-03-10 NOTE — Progress Notes (Signed)
   Subjective:    Patient ID: Sheryl Mcclure, female    DOB: Jan 29, 1971, 44 y.o.   MRN: [id number]  HPI  Pt presents with pain in left heel lasting approx 1 month and worsening. She has tried otc NSAIDS with not much relief. She is on her feet a lot for her job and the pain becomes worse as the day progresses  Review of Systems  All other systems reviewed and are negative.      Objective:   Physical Exam        Assessment & Plan:

## 2015-03-10 NOTE — Progress Notes (Signed)
Subjective:     Patient ID: Sheryl Mcclure, female   DOB: Feb 26, 1971, 44 y.o.   MRN: [id number]  HPI patient states she's getting a lot of pain on the bottom of her left heel and her feet are flat and can become tired by the end of the day. Patient is obese which is a complicating factor   Review of Systems  All other systems reviewed and are negative.      Objective:   Physical Exam  Constitutional: She is oriented to person, place, and time.  Cardiovascular: Intact distal pulses.   Musculoskeletal: Normal range of motion.  Neurological: She is oriented to person, place, and time.  Skin: Skin is warm.  Nursing note and vitals reviewed.  neurovascular status intact muscle strength adequate with range of motion subtalar midtarsal joint within normal limits. Patient does have significant equinus condition and is noted to have severe flatfoot deformity bilateral with pain in the plantar aspect of the left heel at the insertion of the tendon into the calcaneus with fluid buildup noted around the area     Assessment:     Acute plantar fasciitis left with obesity and flatfoot deformity as complicating factors    Plan:     H&P and x-ray reviewed and today I injected the plantar fascial left 3 mg Kenalog 5 mg Xylocaine and applied fascial brace placed on oral anti-inflammatory diclofenac and gave instructions for physical therapy. Reappoint to recheck again in 1 week

## 2015-03-10 NOTE — Patient Instructions (Signed)

## 2015-03-19 ENCOUNTER — Ambulatory Visit (INDEPENDENT_AMBULATORY_CARE_PROVIDER_SITE_OTHER): Payer: BLUE CROSS/BLUE SHIELD | Admitting: Podiatry

## 2015-03-19 ENCOUNTER — Encounter: Payer: Self-pay | Admitting: Podiatry

## 2015-03-19 VITALS — BP 118/67 | HR 83 | Resp 15

## 2015-03-19 DIAGNOSIS — M722 Plantar fascial fibromatosis: Secondary | ICD-10-CM | POA: Diagnosis not present

## 2015-03-19 DIAGNOSIS — M79672 Pain in left foot: Secondary | ICD-10-CM | POA: Diagnosis not present

## 2015-03-20 NOTE — Progress Notes (Signed)
Subjective:     Patient ID: Sheryl Mcclure, female   DOB: 10/01/1970, 44 y.o.   MRN: [id number]  HPI patient states my foot been feeling quite a bit better but still sore when I walk or I try to be active.   Review of Systems     Objective:   Physical Exam Neurovascular status intact muscle strength was found to be adequate with discomfort plantar aspect both feet still present upon palpation with depression of the arch noted    Assessment:     Tendinitis with inflammation with diminishment of edema noted    Plan:     Due to long-standing foot structural issues patient is scanned for a Berkley type orthotic device and was given instructions on physical therapy. Reappoint when returned

## 2015-03-26 DIAGNOSIS — M722 Plantar fascial fibromatosis: Secondary | ICD-10-CM

## 2017-01-14 ENCOUNTER — Other Ambulatory Visit: Payer: Self-pay | Admitting: Family Medicine

## 2017-01-14 DIAGNOSIS — Z1231 Encounter for screening mammogram for malignant neoplasm of breast: Secondary | ICD-10-CM

## 2017-02-01 ENCOUNTER — Ambulatory Visit
Admission: RE | Admit: 2017-02-01 | Discharge: 2017-02-01 | Disposition: A | Discharge status: Home / Self Care | Payer: BLUE CROSS/BLUE SHIELD | Source: Ambulatory Visit | Attending: Family Medicine | Admitting: Family Medicine

## 2017-02-01 DIAGNOSIS — Z1231 Encounter for screening mammogram for malignant neoplasm of breast: Secondary | ICD-10-CM

## 2017-04-19 ENCOUNTER — Telehealth: Payer: Self-pay | Admitting: Family Medicine

## 2017-04-19 NOTE — Telephone Encounter (Signed)
Pt received card stating she needed to schedule a colonoscopy.  Please send a referral.

## 2017-04-27 ENCOUNTER — Other Ambulatory Visit: Payer: Self-pay | Admitting: Family Medicine

## 2017-04-27 DIAGNOSIS — Z1211 Encounter for screening for malignant neoplasm of colon: Secondary | ICD-10-CM

## 2017-04-27 NOTE — Telephone Encounter (Signed)
I have sent in a referral to GI.

## 2017-05-17 ENCOUNTER — Encounter: Payer: Self-pay | Admitting: Gastroenterology

## 2017-07-07 ENCOUNTER — Ambulatory Visit: Payer: BLUE CROSS/BLUE SHIELD | Admitting: Gastroenterology

## 2017-07-07 ENCOUNTER — Other Ambulatory Visit: Payer: Self-pay

## 2017-07-11 ENCOUNTER — Encounter: Payer: Self-pay | Admitting: Family Medicine

## 2017-12-26 ENCOUNTER — Ambulatory Visit: Payer: BLUE CROSS/BLUE SHIELD | Admitting: Family Medicine

## 2018-02-27 ENCOUNTER — Other Ambulatory Visit: Payer: Self-pay | Admitting: Family Medicine

## 2018-02-27 DIAGNOSIS — Z1231 Encounter for screening mammogram for malignant neoplasm of breast: Secondary | ICD-10-CM

## 2018-03-29 ENCOUNTER — Ambulatory Visit: Payer: BLUE CROSS/BLUE SHIELD

## 2018-04-04 ENCOUNTER — Ambulatory Visit
Admission: RE | Admit: 2018-04-04 | Discharge: 2018-04-04 | Disposition: A | Discharge status: Home / Self Care | Payer: BLUE CROSS/BLUE SHIELD | Source: Ambulatory Visit | Attending: Family Medicine | Admitting: Family Medicine

## 2018-04-04 DIAGNOSIS — Z1231 Encounter for screening mammogram for malignant neoplasm of breast: Secondary | ICD-10-CM

## 2021-10-19 ENCOUNTER — Other Ambulatory Visit (HOSPITAL_COMMUNITY)
Admission: RE | Admit: 2021-10-19 | Discharge: 2021-10-19 | Disposition: A | Discharge status: Home / Self Care | Payer: BC Managed Care – PPO | Source: Ambulatory Visit | Attending: Obstetrics and Gynecology | Admitting: Obstetrics and Gynecology

## 2021-10-19 ENCOUNTER — Ambulatory Visit: Payer: BC Managed Care – PPO | Admitting: Obstetrics and Gynecology

## 2021-10-19 ENCOUNTER — Encounter: Payer: Self-pay | Admitting: Obstetrics and Gynecology

## 2021-10-19 ENCOUNTER — Other Ambulatory Visit: Payer: Self-pay

## 2021-10-19 VITALS — BP 136/85 | HR 69 | Ht 67.0 in | Wt 310.0 lb

## 2021-10-19 DIAGNOSIS — Z01419 Encounter for gynecological examination (general) (routine) without abnormal findings: Secondary | ICD-10-CM | POA: Insufficient documentation

## 2021-10-19 NOTE — Progress Notes (Signed)
NGYN pt presents for annual. STD testing declined  Normal pap 3 yrs ago per pt  Last Mammogram 3 yrs ago  PHQ9=0 GAD7=0

## 2021-10-19 NOTE — Progress Notes (Addendum)
Subjective:     Sheryl Mcclure is a 51 y.o. female P2 with LMP 10/17/2021 and BMI 48 who is here for a comprehensive physical exam. The patient reports no problems. Patient reports a monthly period lasting 7 days. She is using BTL for contraception. She denies pelvic pain or abnormal discharge. She denies dyspareunia or urinary incontinence. Patient is without any complaints  Past Medical History:  Diagnosis Date   Morbid obesity (HCC)    Plantar fasciitis    Preterm labor    Sickle-cell trait (HCC)    Vitamin D deficiency    Past Surgical History:  Procedure Laterality Date   APPENDECTOMY     CESAREAN SECTION     x1, #3, Last pregnancy   TUBAL LIGATION     Family History  Problem Relation Age of Onset   Hypertension Mother    Anesthesia problems Neg Hx    Breast cancer Neg Hx     Social History   Socioeconomic History   Marital status: Married    Spouse name: Not on file   Number of children: Not on file   Years of education: Not on file   Highest education level: Not on file  Occupational History   Not on file  Tobacco Use   Smoking status: Never   Smokeless tobacco: Never  Substance and Sexual Activity   Alcohol use: No   Drug use: No   Sexual activity: Yes    Birth control/protection: None, Surgical  Other Topics Concern   Not on file  Social History Narrative   Not on file   Social Determinants of Health   Financial Resource Strain: Not on file  Food Insecurity: Not on file  Transportation Needs: Not on file  Physical Activity: Not on file  Stress: Not on file  Social Connections: Not on file  Intimate Partner Violence: Not on file   Health Maintenance  Topic Date Due   COVID-19 Vaccine (1) Never done   Hepatitis C Screening  Never done   COLONOSCOPY (Pts 45-21yrs Insurance coverage will need to be confirmed)  Never done   PAP SMEAR-Modifier  01/21/2018   INFLUENZA VACCINE  04/13/2021   TETANUS/TDAP  12/13/2021   HIV Screening  Completed   HPV  VACCINES  Aged Out       Review of Systems Pertinent items noted in HPI and remainder of comprehensive ROS otherwise negative.   Objective:  Blood pressure 136/85, pulse 69, height 5\' 7"  (1.702 m), weight (!) 310 lb (140.6 kg), last menstrual period 10/17/2021, unknown if currently breastfeeding.   GENERAL: Well-developed, well-nourished female in no acute distress.  HEENT: Normocephalic, atraumatic. Sclerae anicteric.  NECK: Supple. Normal thyroid.  LUNGS: Clear to auscultation bilaterally.  HEART: Regular rate and rhythm. BREASTS: Symmetric in size. No palpable masses or lymphadenopathy, skin changes, or nipple drainage. ABDOMEN: Soft, nontender, nondistended. No organomegaly. PELVIC: Normal external female genitalia. Vagina is pink and rugated.  Normal discharge. Normal appearing cervix. Bimanual exam limited secondary to body habitus. No adnexal mass or tenderness. Chaperone present during the pelvic exam EXTREMITIES: No cyanosis, clubbing, or edema, 2+ distal pulses.     Assessment:    Healthy female exam.      Plan:    Pap smear collected Screening mammogram ordered Health maintenance labs ordered Colonoscopy referral placed Patient will be contacted with abnormal results See After Visit Summary for Counseling Recommendations

## 2021-10-20 LAB — LIPID PANEL
Chol/HDL Ratio: 3.9 ratio (ref 0.0–4.4)
Cholesterol, Total: 185 mg/dL (ref 100–199)
HDL: 48 mg/dL (ref >39)
LDL Chol Calc (NIH): 120 mg/dL — ABNORMAL HIGH (ref 0–99)
Triglycerides: 92 mg/dL (ref 0–149)
VLDL Cholesterol Cal: 17 mg/dL (ref 5–40)

## 2021-10-20 LAB — COMPREHENSIVE METABOLIC PANEL
ALT: 14 IU/L (ref 0–32)
AST: 12 IU/L (ref 0–40)
Albumin/Globulin Ratio: 1.4 (ref 1.2–2.2)
Albumin: 4.2 g/dL (ref 3.8–4.8)
Alkaline Phosphatase: 79 IU/L (ref 44–121)
BUN/Creatinine Ratio: 24 — ABNORMAL HIGH (ref 9–23)
BUN: 17 mg/dL (ref 6–24)
Bilirubin Total: 0.2 mg/dL (ref 0.0–1.2)
CO2: 22 mmol/L (ref 20–29)
Calcium: 9.5 mg/dL (ref 8.7–10.2)
Chloride: 107 mmol/L — ABNORMAL HIGH (ref 96–106)
Creatinine, Ser: 0.7 mg/dL (ref 0.57–1.00)
Globulin, Total: 2.9 g/dL (ref 1.5–4.5)
Glucose: 91 mg/dL (ref 70–99)
Potassium: 4.2 mmol/L (ref 3.5–5.2)
Sodium: 141 mmol/L (ref 134–144)
Total Protein: 7.1 g/dL (ref 6.0–8.5)
eGFR: 106 mL/min/{1.73_m2} (ref >59)

## 2021-10-20 LAB — CBC
Hematocrit: 33.7 % — ABNORMAL LOW (ref 34.0–46.6)
Hemoglobin: 11 g/dL — ABNORMAL LOW (ref 11.1–15.9)
MCH: 26.8 pg (ref 26.6–33.0)
MCHC: 32.6 g/dL (ref 31.5–35.7)
MCV: 82 fL (ref 79–97)
Platelets: 332 10*3/uL (ref 150–450)
RBC: 4.11 x10E6/uL (ref 3.77–5.28)
RDW: 15.6 % — ABNORMAL HIGH (ref 11.7–15.4)
WBC: 5.3 10*3/uL (ref 3.4–10.8)

## 2021-10-20 LAB — HEMOGLOBIN A1C
Est. average glucose Bld gHb Est-mCnc: 117 mg/dL
Hgb A1c MFr Bld: 5.7 % — ABNORMAL HIGH (ref 4.8–5.6)

## 2021-10-20 LAB — TSH: TSH: 0.944 u[IU]/mL (ref 0.450–4.500)

## 2021-10-21 LAB — CYTOLOGY - PAP
Comment: NEGATIVE
Diagnosis: NEGATIVE
High risk HPV: NEGATIVE

## 2021-11-05 ENCOUNTER — Ambulatory Visit: Payer: BC Managed Care – PPO

## 2021-11-06 ENCOUNTER — Ambulatory Visit
Admission: RE | Admit: 2021-11-06 | Discharge: 2021-11-06 | Disposition: A | Discharge status: Home / Self Care | Payer: BC Managed Care – PPO | Source: Ambulatory Visit | Attending: Obstetrics and Gynecology | Admitting: Obstetrics and Gynecology

## 2021-11-06 DIAGNOSIS — Z1231 Encounter for screening mammogram for malignant neoplasm of breast: Secondary | ICD-10-CM | POA: Diagnosis not present

## 2021-11-06 DIAGNOSIS — Z01419 Encounter for gynecological examination (general) (routine) without abnormal findings: Secondary | ICD-10-CM

## 2022-12-23 IMAGING — MG MM DIGITAL SCREENING BILAT W/ TOMO AND CAD
8 series · 8 of 24 positions shown · non-contrast
Comparison: Previous exam(s).

CLINICAL DATA: Screening.

EXAM:
DIGITAL SCREENING BILATERAL MAMMOGRAM WITH TOMOSYNTHESIS AND CAD
TECHNIQUE: Bilateral screening digital craniocaudal and mediolateral oblique
mammograms were obtained. Bilateral screening digital breast
tomosynthesis was performed. The images were evaluated with
computer-aided detection.

[L CC synth-2D]
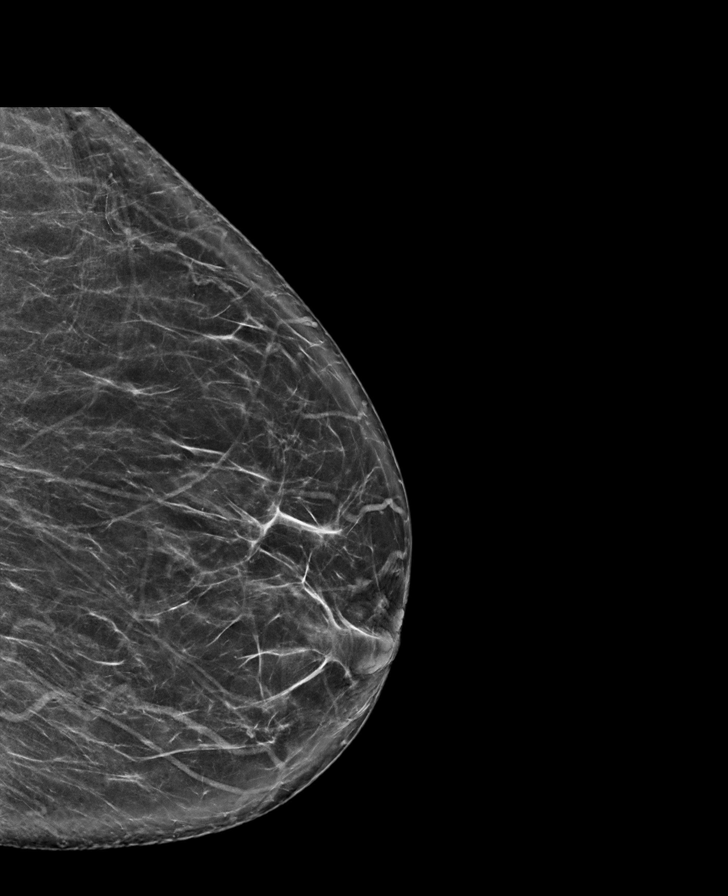

[R CC synth-2D]
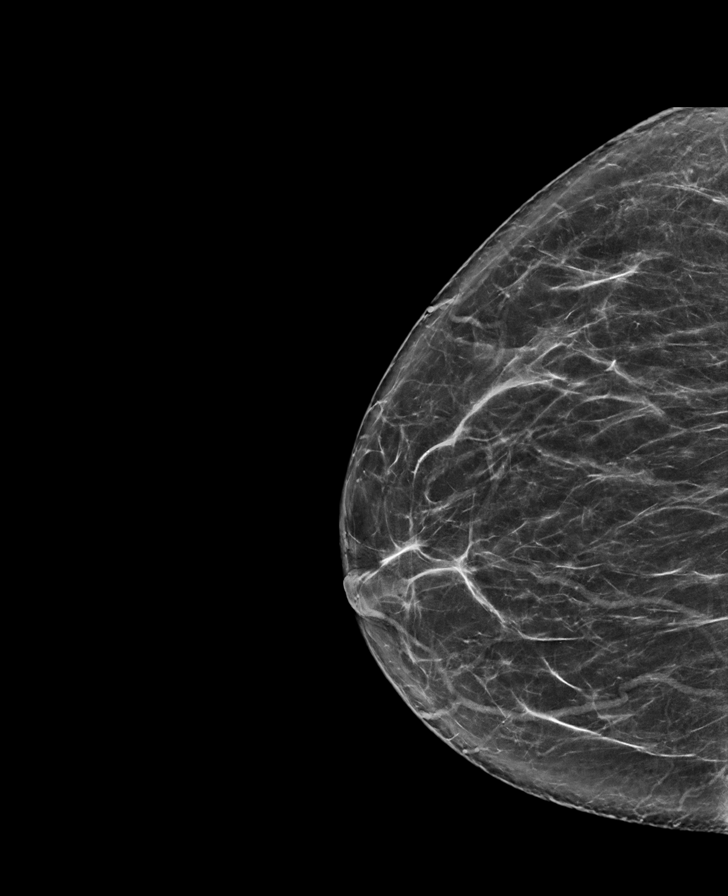

[R MLO synth-2D]
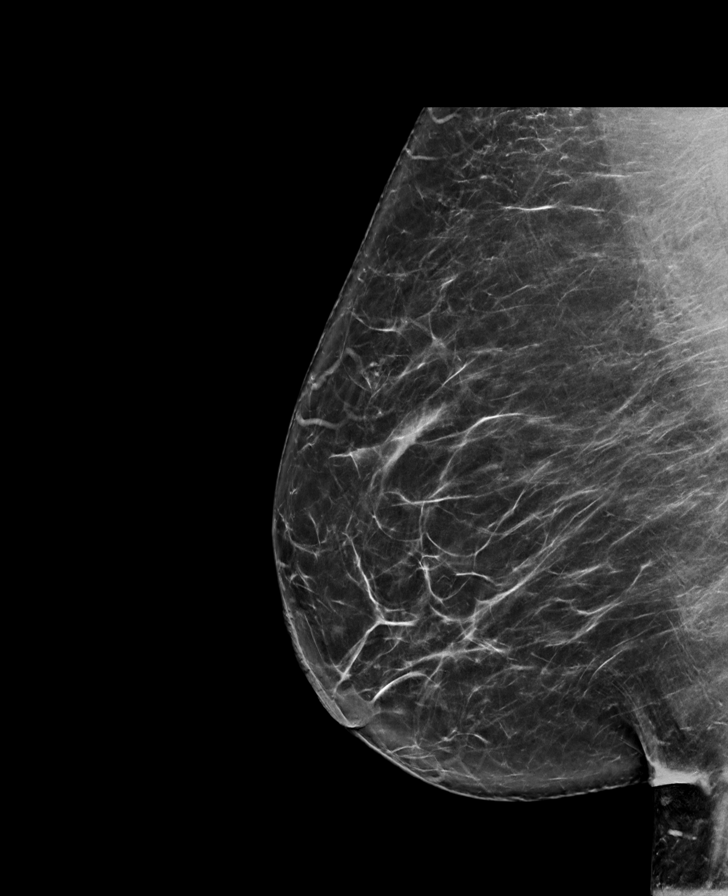

[L MLO synth-2D]
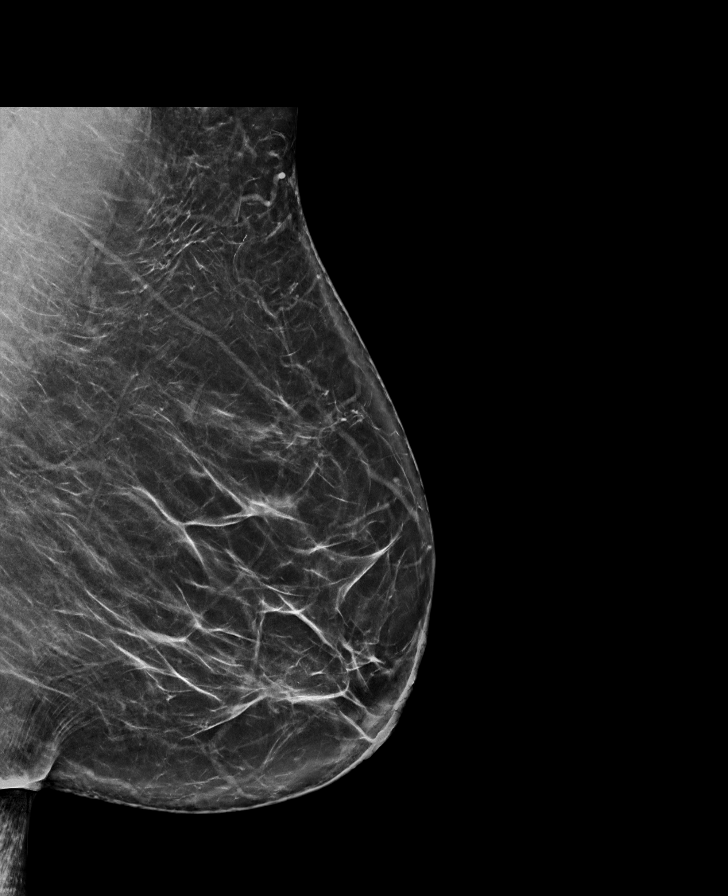

[R MLO tomo · tomo slice 45/88.0]
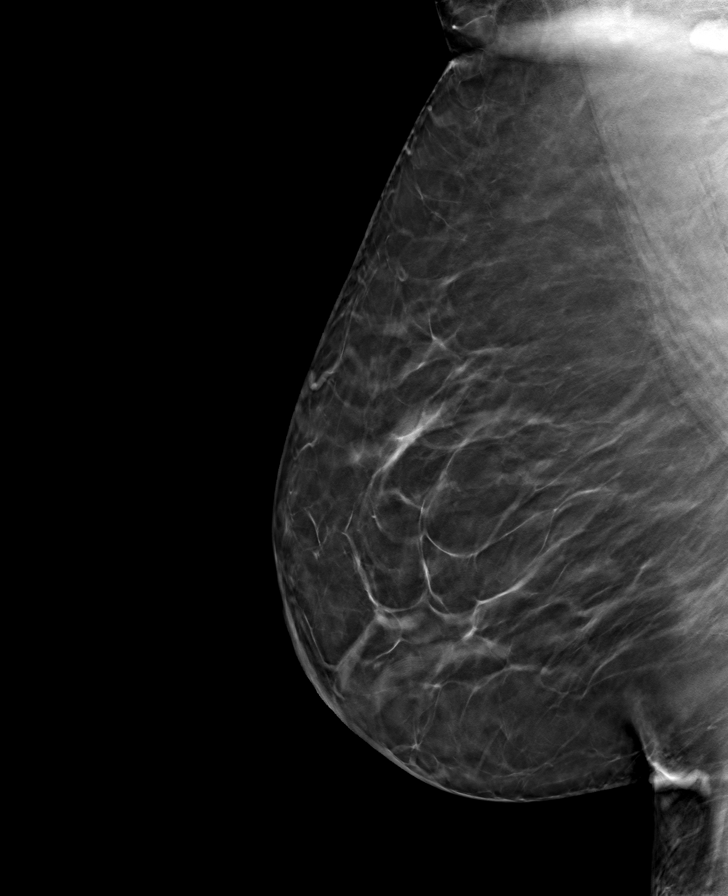

[R CC tomo · tomo slice 40/79.0]
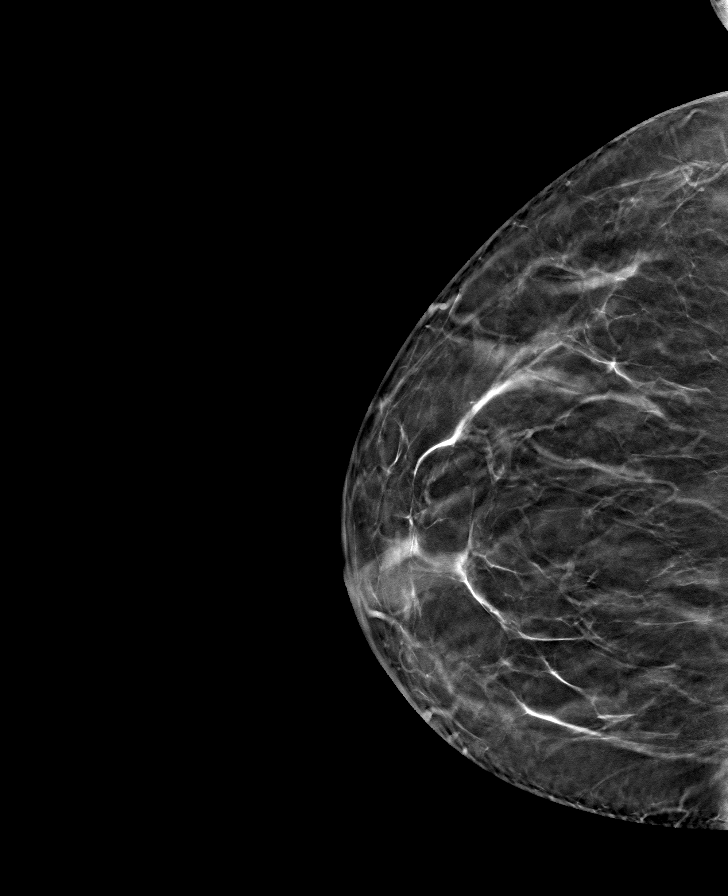

[L MLO tomo · tomo slice 45/89.0]
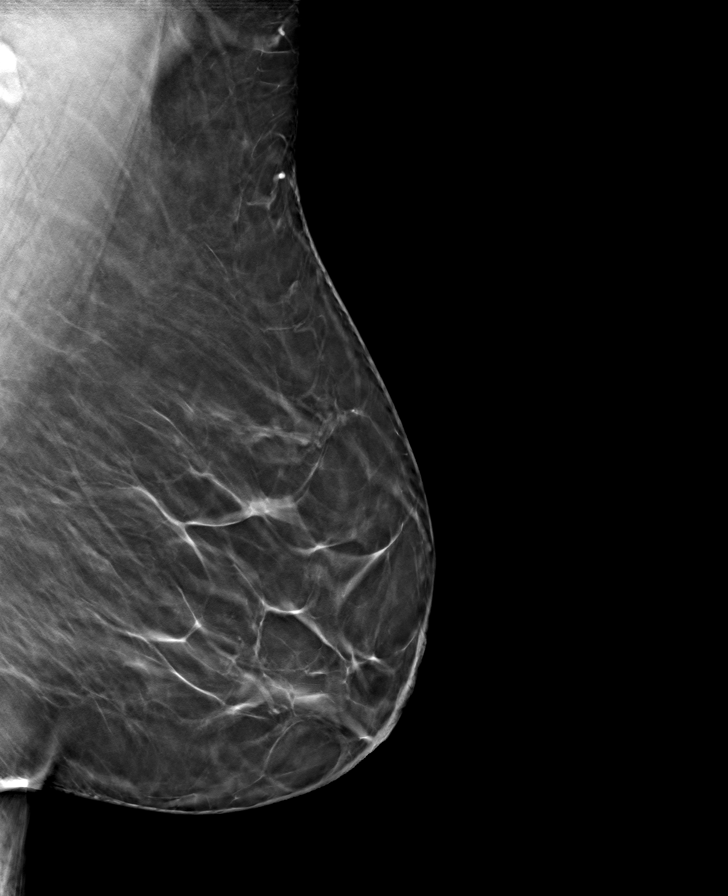

[L CC tomo · tomo slice 42/83.0]
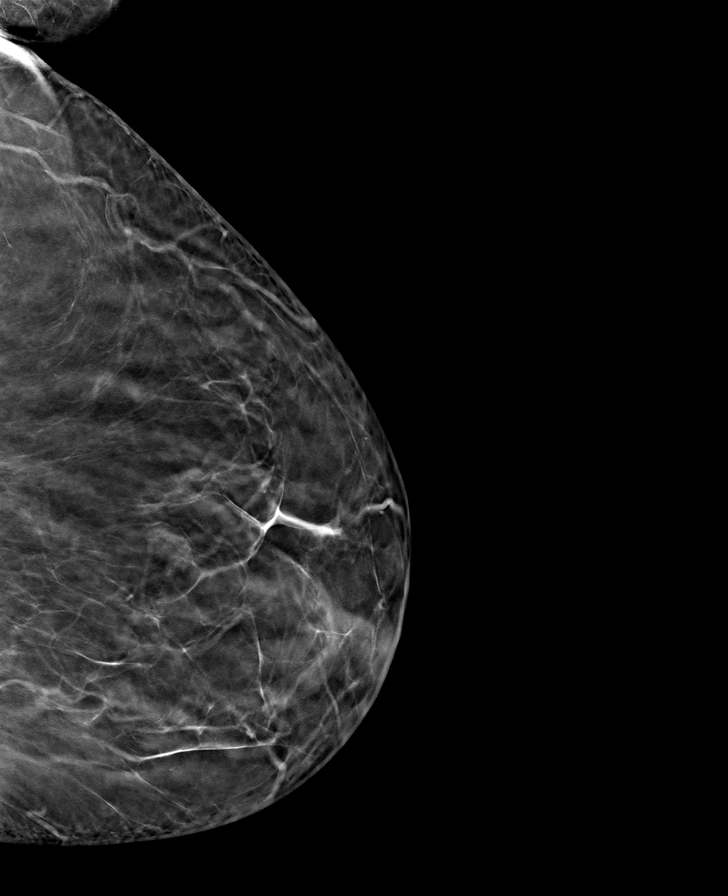

[8 of 24 positions shown; findings below may reference images not displayed]

ACR Breast Density Category b: There are scattered areas of
fibroglandular density.
FINDINGS: There are no findings suspicious for malignancy.
IMPRESSION: No mammographic evidence of malignancy. A result letter of this
screening mammogram will be mailed directly to the patient.

RECOMMENDATION:
Screening mammogram in one year. (Code:51-O-LD2)

BI-RADS CATEGORY  1: Negative.

## 2023-05-27 ENCOUNTER — Other Ambulatory Visit: Payer: Self-pay | Admitting: Obstetrics and Gynecology

## 2023-05-27 DIAGNOSIS — Z1231 Encounter for screening mammogram for malignant neoplasm of breast: Secondary | ICD-10-CM

## 2023-06-06 ENCOUNTER — Other Ambulatory Visit: Payer: Self-pay | Admitting: Obstetrics and Gynecology

## 2023-06-06 DIAGNOSIS — Z1231 Encounter for screening mammogram for malignant neoplasm of breast: Secondary | ICD-10-CM

## 2023-06-08 ENCOUNTER — Ambulatory Visit
Admission: RE | Admit: 2023-06-08 | Discharge: 2023-06-08 | Disposition: A | Discharge status: Home / Self Care | Payer: BC Managed Care – PPO | Source: Ambulatory Visit

## 2023-06-08 DIAGNOSIS — Z1231 Encounter for screening mammogram for malignant neoplasm of breast: Secondary | ICD-10-CM

## 2024-08-14 ENCOUNTER — Other Ambulatory Visit: Payer: Self-pay | Admitting: Family Medicine

## 2024-08-14 DIAGNOSIS — Z1231 Encounter for screening mammogram for malignant neoplasm of breast: Secondary | ICD-10-CM

## 2024-08-17 ENCOUNTER — Ambulatory Visit

## 2024-08-17 ENCOUNTER — Inpatient Hospital Stay: Admission: RE | Admit: 2024-08-17 | Discharge: 2024-08-17 | Attending: Family Medicine | Admitting: Family Medicine

## 2024-08-17 DIAGNOSIS — Z1231 Encounter for screening mammogram for malignant neoplasm of breast: Secondary | ICD-10-CM
